# Patient Record
Sex: Female | Born: 1960 | Race: White | Hispanic: No | Marital: Married | Smoking: Former smoker
Health system: Southern US, Community
[De-identification: ages and names within clinical notes are randomized; demographics above are authoritative.]

## PROBLEM LIST (undated history)

## (undated) DIAGNOSIS — N83209 Unspecified ovarian cyst, unspecified side: Secondary | ICD-10-CM

## (undated) DIAGNOSIS — K5900 Constipation, unspecified: Secondary | ICD-10-CM

## (undated) DIAGNOSIS — M810 Age-related osteoporosis without current pathological fracture: Secondary | ICD-10-CM

## (undated) DIAGNOSIS — T7840XA Allergy, unspecified, initial encounter: Secondary | ICD-10-CM

## (undated) DIAGNOSIS — Z889 Allergy status to unspecified drugs, medicaments and biological substances status: Secondary | ICD-10-CM

## (undated) DIAGNOSIS — K589 Irritable bowel syndrome without diarrhea: Secondary | ICD-10-CM

## (undated) DIAGNOSIS — Z8619 Personal history of other infectious and parasitic diseases: Secondary | ICD-10-CM

## (undated) DIAGNOSIS — Z820 Family history of epilepsy and other diseases of the nervous system: Secondary | ICD-10-CM

## (undated) DIAGNOSIS — M8580 Other specified disorders of bone density and structure, unspecified site: Secondary | ICD-10-CM

## (undated) DIAGNOSIS — F419 Anxiety disorder, unspecified: Secondary | ICD-10-CM

## (undated) DIAGNOSIS — K219 Gastro-esophageal reflux disease without esophagitis: Secondary | ICD-10-CM

## (undated) DIAGNOSIS — H269 Unspecified cataract: Secondary | ICD-10-CM

## (undated) DIAGNOSIS — I10 Essential (primary) hypertension: Secondary | ICD-10-CM

## (undated) DIAGNOSIS — R011 Cardiac murmur, unspecified: Secondary | ICD-10-CM

## (undated) DIAGNOSIS — E785 Hyperlipidemia, unspecified: Secondary | ICD-10-CM

## (undated) HISTORY — DX: Unspecified ovarian cyst, unspecified side: N83.209

## (undated) HISTORY — DX: Family history of epilepsy and other diseases of the nervous system: Z82.0

## (undated) HISTORY — DX: Allergy, unspecified, initial encounter: T78.40XA

## (undated) HISTORY — PX: POLYPECTOMY: SHX149

## (undated) HISTORY — DX: Constipation, unspecified: K59.00

## (undated) HISTORY — DX: Personal history of other infectious and parasitic diseases: Z86.19

## (undated) HISTORY — DX: Anxiety disorder, unspecified: F41.9

## (undated) HISTORY — DX: Essential (primary) hypertension: I10

## (undated) HISTORY — PX: COLONOSCOPY: SHX174

## (undated) HISTORY — DX: Age-related osteoporosis without current pathological fracture: M81.0

## (undated) HISTORY — PX: OTHER SURGICAL HISTORY: SHX169

## (undated) HISTORY — DX: Cardiac murmur, unspecified: R01.1

## (undated) HISTORY — DX: Irritable bowel syndrome, unspecified: K58.9

## (undated) HISTORY — DX: Hyperlipidemia, unspecified: E78.5

## (undated) HISTORY — DX: Other specified disorders of bone density and structure, unspecified site: M85.80

## (undated) HISTORY — DX: Unspecified cataract: H26.9

---
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
## 1997-11-16 ENCOUNTER — Other Ambulatory Visit: Admission: RE | Admit: 1997-11-16 | Discharge: 1997-11-16 | Payer: Self-pay | Admitting: Obstetrics and Gynecology

## 1999-05-14 ENCOUNTER — Other Ambulatory Visit: Admission: RE | Admit: 1999-05-14 | Discharge: 1999-05-14 | Payer: Self-pay | Admitting: Obstetrics and Gynecology

## 2000-07-24 ENCOUNTER — Other Ambulatory Visit: Admission: RE | Admit: 2000-07-24 | Discharge: 2000-07-24 | Payer: Self-pay | Admitting: Obstetrics and Gynecology

## 2002-03-16 ENCOUNTER — Other Ambulatory Visit: Admission: RE | Admit: 2002-03-16 | Discharge: 2002-03-16 | Payer: Self-pay | Admitting: Obstetrics and Gynecology

## 2003-04-17 ENCOUNTER — Other Ambulatory Visit: Admission: RE | Admit: 2003-04-17 | Discharge: 2003-04-17 | Payer: Self-pay | Admitting: Obstetrics and Gynecology

## 2004-05-28 ENCOUNTER — Other Ambulatory Visit: Admission: RE | Admit: 2004-05-28 | Discharge: 2004-05-28 | Payer: Self-pay | Admitting: Obstetrics and Gynecology

## 2004-06-05 ENCOUNTER — Encounter: Admission: RE | Admit: 2004-06-05 | Discharge: 2004-06-05 | Payer: Self-pay | Admitting: Obstetrics and Gynecology

## 2005-06-09 ENCOUNTER — Encounter: Admission: RE | Admit: 2005-06-09 | Discharge: 2005-06-09 | Payer: Self-pay | Admitting: Obstetrics and Gynecology

## 2005-07-01 ENCOUNTER — Other Ambulatory Visit: Admission: RE | Admit: 2005-07-01 | Discharge: 2005-07-01 | Payer: Self-pay | Admitting: Obstetrics and Gynecology

## 2011-04-15 ENCOUNTER — Other Ambulatory Visit (HOSPITAL_COMMUNITY): Payer: Self-pay | Admitting: Obstetrics and Gynecology

## 2011-04-15 DIAGNOSIS — E049 Nontoxic goiter, unspecified: Secondary | ICD-10-CM

## 2011-04-17 ENCOUNTER — Other Ambulatory Visit (HOSPITAL_COMMUNITY): Payer: Self-pay

## 2011-04-23 ENCOUNTER — Ambulatory Visit (HOSPITAL_COMMUNITY)
Admission: RE | Admit: 2011-04-23 | Discharge: 2011-04-23 | Disposition: A | Discharge status: Home / Self Care | Payer: BC Managed Care – PPO | Source: Ambulatory Visit | Attending: Obstetrics and Gynecology | Admitting: Obstetrics and Gynecology

## 2011-04-23 DIAGNOSIS — E049 Nontoxic goiter, unspecified: Secondary | ICD-10-CM

## 2011-04-23 DIAGNOSIS — E041 Nontoxic single thyroid nodule: Secondary | ICD-10-CM | POA: Insufficient documentation

## 2011-05-06 ENCOUNTER — Ambulatory Visit (INDEPENDENT_AMBULATORY_CARE_PROVIDER_SITE_OTHER): Payer: BC Managed Care – PPO | Admitting: Surgery

## 2011-05-06 ENCOUNTER — Encounter (INDEPENDENT_AMBULATORY_CARE_PROVIDER_SITE_OTHER): Payer: Self-pay | Admitting: Surgery

## 2011-05-06 VITALS — BP 122/78 | HR 66 | Temp 98.0°F | Resp 16 | Ht 67.25 in | Wt 177.6 lb

## 2011-05-06 DIAGNOSIS — E041 Nontoxic single thyroid nodule: Secondary | ICD-10-CM

## 2011-05-06 DIAGNOSIS — E042 Nontoxic multinodular goiter: Secondary | ICD-10-CM | POA: Insufficient documentation

## 2011-05-06 NOTE — Progress Notes (Signed)
Chief Complaint  Patient presents with  . Thyroid Nodule    Evaluate Thyroid & Parathyroid - referral by Dr. Richardean Chimera    HISTORY: Patient is a 50 year old female referred by her gynecologist for her newly diagnosed thyroid nodules. Patient had been seen for her annual exam in September 2012. She was found on physical exam to have irregularity of the thyroid. She underwent thyroid ultrasound which showed a normal sized thyroid gland. In the left inferior pole was a 1.2 cm nodule. Below the right thyroid lobe was a 1.6 cm nodule suspicious for parathyroid adenoma. Laboratory studies were obtained. TSH level was normal at 3.154. Total T4 level was normal at 9.0. Intact parathyroid hormone level was normal at 40. Serum calcium level was normal at 9.7.  Patient is referred at this time for evaluation of thyroid nodules. Patient does have a history of a sister with likely thyroid nodule. She has undergone biopsy which is presumably benign. There is no family history of thyroid cancer. The patient has not had any head or neck surgery. She has never been on thyroid medication.   Past Medical History  Diagnosis Date  . FHx: migraine headaches   . Heart murmur     H/O   . Hypertension      No current outpatient prescriptions on file.     No Known Allergies   Family History  Problem Relation Age of Onset  . Cancer Mother 46    History of Colon Cancer 2004  . Diabetes Father   . Arthritis Father   . Heart disease Father   . Cancer Maternal Uncle   . Cancer Maternal Grandmother   . Cancer Maternal Uncle   . Cancer Cousin      History   Social History  . Marital Status: Single    Spouse Name: N/A    Number of Children: N/A  . Years of Education: N/A   Social History Main Topics  . Smoking status: Former Games developer  . Smokeless tobacco: None  . Alcohol Use: No  . Drug Use: No  . Sexually Active: None   Other Topics Concern  . None   Social History Narrative  . None      REVIEW OF SYSTEMS - PERTINENT POSITIVES ONLY: Patient does note fatigue. This is significantly increased compared to past years. She has noted difficulty in losing weight although she has lost 10 pounds over the last year and a half with an exercise and diet program. She denies dysphagia. She denies tremor. She denies palpitations.   EXAM: Filed Vitals:   05/06/11 1651  BP: 122/78  Pulse: 66  Temp: 98 F (36.7 C)  Resp: 16    HEENT: normocephalic; pupils equal and reactive; sclerae clear; dentition good; mucous membranes moist NECK:  On palpation the right thyroid lobe is slightly more firm and slightly irregular compared to the left lobe which is soft and normal. There is a palpable nodule in the inferior pole of the left thyroid lobe measuring slightly greater than 1 cm in size. It is soft and nontender; symmetric on extension; no palpable anterior or posterior cervical lymphadenopathy; no supraclavicular masses; no tenderness CHEST: clear to auscultation bilaterally without rales, rhonchi, or wheezes CARDIAC: regular rate and rhythm without significant murmur; peripheral pulses are full EXT:  non-tender without edema; no deformity NEURO: no gross focal deficits; no sign of tremor   LABORATORY RESULTS: See E-Chart for most recent results   RADIOLOGY RESULTS: See E-Chart or I-Site for  most recent results   IMPRESSION: #1 left thyroid nodule, 1.2 cm #2 right thyroid nodule, 1.6 cm, slightly below the normal right thyroid lobe on ultrasound scan #3 normal parathyroid hormone level and serum calcium level   PLAN: The patient and I discussed the above findings at length. I think it is safe to observe her newly diagnosed thyroid nodules for the time being. I have recommended a short-term followup with thyroid ultrasound and TSH level in 6 months. At that time we will consider thyroid hormone suppression versus possible fine needle aspiration biopsy.  Patient will return in  6 months for physical examination and review of her ultrasound results and her TSH level.   Velora Heckler, MD, FACS General & Endocrine Surgery [facility]      Visit Diagnoses: 1. Thyroid nodule, uninodular     Primary Care Physician: Abner Greenspan, MD  GYN:  Richardean Chimera MD

## 2011-11-04 ENCOUNTER — Ambulatory Visit
Admission: RE | Admit: 2011-11-04 | Discharge: 2011-11-04 | Disposition: A | Discharge status: Home / Self Care | Payer: BC Managed Care – PPO | Source: Ambulatory Visit | Attending: Surgery | Admitting: Surgery

## 2011-11-04 DIAGNOSIS — E041 Nontoxic single thyroid nodule: Secondary | ICD-10-CM

## 2011-11-07 ENCOUNTER — Telehealth (INDEPENDENT_AMBULATORY_CARE_PROVIDER_SITE_OTHER): Payer: Self-pay

## 2011-11-07 ENCOUNTER — Other Ambulatory Visit (INDEPENDENT_AMBULATORY_CARE_PROVIDER_SITE_OTHER): Payer: Self-pay

## 2011-11-07 DIAGNOSIS — E041 Nontoxic single thyroid nodule: Secondary | ICD-10-CM

## 2011-11-07 NOTE — Telephone Encounter (Signed)
The patient would like her ultrasound results.

## 2011-11-07 NOTE — Telephone Encounter (Signed)
Generic message left advising patient to schedule an appointment here in the office and review test results.

## 2011-12-08 ENCOUNTER — Telehealth (INDEPENDENT_AMBULATORY_CARE_PROVIDER_SITE_OTHER): Payer: Self-pay

## 2011-12-08 NOTE — Telephone Encounter (Signed)
Order for labs at [facility] in epic. LMOM for pt. Need tsh prior to appt. On 5-21.

## 2011-12-16 ENCOUNTER — Ambulatory Visit (INDEPENDENT_AMBULATORY_CARE_PROVIDER_SITE_OTHER): Payer: BC Managed Care – PPO | Admitting: Surgery

## 2011-12-16 ENCOUNTER — Encounter (INDEPENDENT_AMBULATORY_CARE_PROVIDER_SITE_OTHER): Payer: Self-pay | Admitting: Surgery

## 2011-12-16 VITALS — BP 130/72 | HR 64 | Temp 98.7°F | Resp 12 | Ht 67.5 in | Wt 172.4 lb

## 2011-12-16 DIAGNOSIS — E041 Nontoxic single thyroid nodule: Secondary | ICD-10-CM

## 2011-12-16 NOTE — Patient Instructions (Signed)
Thyroid Diseases Your thyroid is a butterfly-shaped gland in your neck. It is located just above your collarbone. It is one of your endocrine glands, which make hormones. The thyroid helps set your metabolism. Metabolism is how your body gets energy from the foods you eat.  Millions of people have thyroid diseases. Women experience thyroid problems more often than men. In fact, overactive thyroid problems (hyperthyroidism) occur in 1% of all women. If you have a thyroid disease, your body may use energy more slowly or quickly than it should.  Thyroid problems also include an immune disease where your body reacts against your thyroid gland (called thyroiditis). A different problem involves lumps and bumps (called nodules) that develop in the gland. The nodules are usually, but not always, noncancerous. THE MOST COMMON THYROID PROBLEMS AND CAUSES ARE DISCUSSED BELOW There are many causes for thyroid problems. Treatment depends upon the exact diagnosis and includes trying to reset your body's metabolism to a normal rate. Hyperthyroidism Too much thyroid hormone from an overactive thyroid gland is called hyperthyroidism. In hyperthyroidism, the body's metabolism speeds up. One of the most frequent forms of hyperthyroidism is known as Graves' disease. Graves' disease tends to run in families. Although Graves' is thought to be caused by a problem with the immune system, the exact nature of the genetic problem is unknown. Hypothyroidism Too little thyroid hormone from an underactive thyroid gland is called hypothyroidism. In hypothyroidism, the body's metabolism is slowed. Several things can cause this condition. Most causes affect the thyroid gland directly and hurt its ability to make enough hormone.  Rarely, there may be a pituitary gland tumor (located near the base of the brain). The tumor can block the pituitary from producing thyroid-stimulating hormone (TSH). Your body makes TSH to stimulate the thyroid  to work properly. If the pituitary does not make enough TSH, the thyroid fails to make enough hormones needed for good health. Whether the problem is caused by thyroid conditions or by the pituitary gland, the result is that the thyroid is not making enough hormones. Hypothyroidism causes many physical and mental processes to become sluggish. The body consumes less oxygen and produces less body heat. Thyroid Nodules A thyroid nodule is a small swelling or lump in the thyroid gland. They are common. These nodules represent either a growth of thyroid tissue or a fluid-filled cyst. Both form a lump in the thyroid gland. Almost half of all people will have tiny thyroid nodules at some point in their lives. Typically, these are not noticeable until they become large and affect normal thyroid size. Larger nodules that are greater than a half inch across (about 1 centimeter) occur in about 5 percent of people. Although most nodules are not cancerous, people who have them should seek medical care to rule out cancer. Also, some thyroid nodules may produce too much thyroid hormone or become too large. Large nodules or a large gland can interfere with breathing or swallowing or may cause neck discomfort. Other problems Other thyroid problems include cancer and thyroiditis. Thyroiditis is a malfunction of the body's immune system. Normally, the immune system works to defend the body against infection and other problems. When the immune system is not working properly, it may mistakenly attack normal cells, tissues, and organs. Examples of autoimmune diseases are Hashimoto's thyroiditis (which causes low thyroid function) and Graves' disease (which causes excess thyroid function). SYMPTOMS  Symptoms vary greatly depending upon the exact type of problem with the thyroid. Hyperthyroidism-is when your thyroid is too   active and makes more thyroid hormone than your body needs. The most common cause is Graves' Disease. Too  much thyroid hormone can cause some or all of the following symptoms:  Anxiety.   Irritability.   Difficulty sleeping.   Fatigue.   A rapid or irregular heartbeat.   A fine tremor of your hands or fingers.   An increase in perspiration.   Sensitivity to heat.   Weight loss, despite normal food intake.   Brittle hair.   Enlargement of your thyroid gland (goiter).   Light menstrual periods.   Frequent bowel movements.  Graves' disease can specifically cause eye and skin problems. The skin problems involve reddening and swelling of the skin, often on your shins and on the top of your feet. Eye problems can include the following:  Excess tearing and sensation of grit or sand in either or both eyes.   Reddened or inflamed eyes.   Widening of the space between your eyelids.   Swelling of the lids and tissues around the eyes.   Light sensitivity.   Ulcers on the cornea.   Double vision.   Limited eye movements.   Blurred or reduced vision.  Hypothyroidism- is when your thyroid gland is not active enough. This is more common than hyperthyroidism. Symptoms can vary a lot depending of the severity of the hormone deficiency. Symptoms may develop over a long period of time and can include several of the following:  Fatigue.   Sluggishness.   Increased sensitivity to cold.   Constipation.   Pale, dry skin.   A puffy face.   Hoarse voice.   High blood cholesterol level.   Unexplained weight gain.   Muscle aches, tenderness and stiffness.   Pain, stiffness or swelling in your joints.   Muscle weakness.   Heavier than normal menstrual periods.   Brittle fingernails and hair.   Depression.  Thyroid Nodules - most do not cause signs or symptoms. Occasionally, some may become so large that you can feel or even see the swelling at the base of your neck. You may realize a lump or swelling is there when you are shaving or putting on makeup. Men might become  aware of a nodule when shirt collars suddenly feel too tight. Some nodules produce too much thyroid hormone. This can produce the same symptoms as hyperthyroidism (see above). Thyroid nodules are seldom cancerous. However, a nodule is more likely to be malignant (cancerous) if it:  Grows quickly or feels hard.   Causes you to become hoarse or to have trouble swallowing or breathing.   Causes enlarged lymph nodes under your jaw or in your neck.  DIAGNOSIS  Because there are so many possible thyroid conditions, your caregiver may ask for a number of tests. They will do this in order to narrow down the exact diagnosis. These tests can include:  Blood and antibody tests.   Special thyroid scans using small, safe amounts of radioactive iodine.   Ultrasound of the thyroid gland (particularly if there is a nodule or lump).   Biopsy. This is usually done with a special needle. A needle biopsy is a procedure to obtain a sample of cells from the thyroid. The tissue will be tested in a lab and examined under a microscope.  TREATMENT  Treatment depends on the exact diagnosis. Hyperthyroidism  Beta-blockers help relieve many of the symptoms.   Anti-thyroid medications prevent the thyroid from making excess hormones.   Radioactive iodine treatment can destroy overactive thyroid   cells. The iodine can permanently decrease the amount of hormone produced.   Surgery to remove the thyroid gland.   Treatments for eye problems that come from Graves' disease also include medications and special eye surgery, if felt to be appropriate.  Hypothyroidism Thyroid replacement with levothyroxine is the mainstay of treatment. Treatment with thyroid replacement is usually lifelong and will require monitoring and adjustment from time to time. Thyroid Nodules  Watchful waiting. If a small nodule causes no symptoms or signs of cancer on biopsy, then no treatment may be chosen at first. Re-exam and re-checking blood  tests would be the recommended follow-up.   Anti-thyroid medications or radioactive iodine treatment may be recommended if the nodules produce too much thyroid hormone (see Treatment for Hyperthyroidism above).   Alcohol ablation. Injections of small amounts of ethyl alcohol (ethanol) can cause a non-cancerous nodule to shrink in size.   Surgery (see Treatment for Hyperthyroidism above).  HOME CARE INSTRUCTIONS   Take medications as instructed.   Follow through on recommended testing.  SEEK MEDICAL CARE IF:   You feel that you are developing symptoms of Hyperthyroidism or Hypothyroidism as described above.   You develop a new lump/nodule in the neck/thyroid area that you had not noticed before.   You feel that you are having side effects from medicines prescribed.   You develop trouble breathing or swallowing.  SEEK IMMEDIATE MEDICAL CARE IF:   You develop a fever of 102 F (38.9 C) or higher.   You develop severe sweating.   You develop palpitations and/or rapid heart beat.   You develop shortness of breath.   You develop nausea and vomiting.   You develop extreme shakiness.   You develop agitation.   You develop lightheadedness or have a fainting episode.  Document Released: 05/11/2007 Document Revised: 07/03/2011 Document Reviewed: 05/11/2007 [employer] Patient Information 2012 [employer]. 

## 2011-12-16 NOTE — Progress Notes (Signed)
Visit Diagnoses: 1. Thyroid nodules     HISTORY: Patient is a 51 year old white female who returns today for followup of thyroid nodules. Patient had been evaluated in the fall of 2012. She returns at this time for examination. On November 04, 2011 she underwent repeat thyroid ultrasound. This showed the overall size of the gland to be stable. The left-sided thyroid nodule remained stable at 1.3 cm. There were no worrisome findings. The questionable right thyroid nodule appears to be part of the thyroid lobe and not a distinct nodule. No other abnormalities were identified. TSH level was obtained by her primary care physician and was normal at 1.75. Patient has never been on thyroid medication.  PERTINENT REVIEW OF SYSTEMS: No change and self exam. No new nodules or masses. No pain or tenderness. No tremors. No palpitations.  EXAM: HEENT: normocephalic; pupils equal and reactive; sclerae clear; dentition good; mucous membranes moist NECK:  Palpable soft nodule left inferior lobe measuring approximately 1.5 cm in dimension. Right lobe without palpable nodules; symmetric on extension; no palpable anterior or posterior cervical lymphadenopathy; no supraclavicular masses; no tenderness CHEST: clear to auscultation bilaterally without rales, rhonchi, or wheezes CARDIAC: regular rate and rhythm without significant murmur; peripheral pulses are full EXT:  non-tender without edema; no deformity NEURO: no gross focal deficits; no sign of tremor   IMPRESSION: Left thyroid nodule, 1.3 cm, clinically stable  PLAN: The patient and I reviewed the above studies. I have reassured her that this is likely a benign thyroid nodule. I would like to re-evaluate it in one year with a followup thyroid ultrasound and a TSH level. She will return at that time for physical examination.  Velora Heckler, MD, FACS General & Endocrine Surgery [facility]

## 2012-01-06 ENCOUNTER — Encounter (INDEPENDENT_AMBULATORY_CARE_PROVIDER_SITE_OTHER): Payer: Self-pay

## 2012-03-14 ENCOUNTER — Encounter (HOSPITAL_COMMUNITY): Payer: Self-pay

## 2012-03-14 ENCOUNTER — Inpatient Hospital Stay (HOSPITAL_COMMUNITY)
Admission: AD | Admit: 2012-03-14 | Discharge: 2012-03-14 | Disposition: A | Discharge status: Home / Self Care | Payer: BC Managed Care – PPO | Source: Ambulatory Visit | Attending: Obstetrics and Gynecology | Admitting: Obstetrics and Gynecology

## 2012-03-14 ENCOUNTER — Inpatient Hospital Stay (HOSPITAL_COMMUNITY): Payer: BC Managed Care – PPO

## 2012-03-14 DIAGNOSIS — R109 Unspecified abdominal pain: Secondary | ICD-10-CM

## 2012-03-14 DIAGNOSIS — K5900 Constipation, unspecified: Secondary | ICD-10-CM | POA: Insufficient documentation

## 2012-03-14 LAB — URINALYSIS, ROUTINE W REFLEX MICROSCOPIC
Bilirubin Urine: NEGATIVE
Glucose, UA: NEGATIVE mg/dL
Ketones, ur: NEGATIVE mg/dL
Leukocytes, UA: NEGATIVE
Nitrite: NEGATIVE
Protein, ur: NEGATIVE mg/dL
Specific Gravity, Urine: 1.01 (ref 1.005–1.030)
Urobilinogen, UA: 0.2 mg/dL (ref 0.0–1.0)
pH: 7 (ref 5.0–8.0)

## 2012-03-14 LAB — COMPREHENSIVE METABOLIC PANEL
ALT: 12 U/L (ref 0–35)
AST: 16 U/L (ref 0–37)
Albumin: 4.2 g/dL (ref 3.5–5.2)
Alkaline Phosphatase: 57 U/L (ref 39–117)
BUN: 7 mg/dL (ref 6–23)
CO2: 32 mEq/L (ref 19–32)
Calcium: 10.2 mg/dL (ref 8.4–10.5)
Chloride: 97 mEq/L (ref 96–112)
Creatinine, Ser: 0.73 mg/dL (ref 0.50–1.10)
GFR calc Af Amer: >90 mL/min (ref >90)
GFR calc non Af Amer: >90 mL/min (ref >90)
Glucose, Bld: 103 mg/dL — ABNORMAL HIGH (ref 70–99)
Potassium: 4.9 mEq/L (ref 3.5–5.1)
Sodium: 137 mEq/L (ref 135–145)
Total Bilirubin: 0.6 mg/dL (ref 0.3–1.2)
Total Protein: 7.3 g/dL (ref 6.0–8.3)

## 2012-03-14 LAB — URINE MICROSCOPIC-ADD ON

## 2012-03-14 LAB — CBC
HCT: 41.4 % (ref 36.0–46.0)
Hemoglobin: 14.2 g/dL (ref 12.0–15.0)
MCH: 29.3 pg (ref 26.0–34.0)
MCHC: 34.3 g/dL (ref 30.0–36.0)
MCV: 85.4 fL (ref 78.0–100.0)
Platelets: 219 10*3/uL (ref 150–400)
RBC: 4.85 MIL/uL (ref 3.87–5.11)
RDW: 14.7 % (ref 11.5–15.5)
WBC: 9.1 10*3/uL (ref 4.0–10.5)

## 2012-03-14 LAB — POCT PREGNANCY, URINE: Preg Test, Ur: NEGATIVE

## 2012-03-14 MED ORDER — PROMETHAZINE HCL 25 MG PO TABS: 25.0000 mg | ORAL_TABLET | Freq: Four times a day (QID) | ORAL | Status: DC | PRN

## 2012-03-14 NOTE — MAU Note (Addendum)
Pt reports having lower abd  For about 10 days more on left side and now radiated towards her back. Reports havign n/v unable to keep much down. No bm since Tues. Saw Dr. Arelia Sneddon on  Friday. Given medication for BV and nuasea and removed IUD. Stil not feeling well today unable to keep much down.

## 2012-03-14 NOTE — MAU Provider Note (Signed)
History     CSN: [id number]  Arrival date & time 03/14/12  1317   None     Chief Complaint  Patient presents with  . Abdominal Pain    (Consider location/radiation/quality/duration/timing/severity/associated sxs/prior treatment) HPI Pamela Luna is a 51 y.o. G1P1.She presents with continued abd pain, nausea and vomiting. Her symptoms started 8/13, she saw Dr Arelia Sneddon on 8/16. He removed her IUD, Rx for clindamycin for BV. Pt continues to have low abd cramping, burning, contracting. She is nauseated q am, has been vomiting today. Has lots of gas, belching. Last full meal 8/13, has had decreased appetite, vomited after eating mostly since. Last nl BM 8/13, sm hard BM today with blood on it. Last colonoscopy 5 yr ago, 1 benign polyp. may have "a touch" of IBS, strong family hx of colon cancer. Next colonoscopy for 8/28.  Past Medical History  Diagnosis Date  . FHx: migraine headaches   . Heart murmur     H/O   . Hypertension     Past Surgical History  Procedure Date  . Tonsillectomy 1966  . Cesarean section 1998  . Scalp cyst excision 2006 - 2009    Family History  Problem Relation Age of Onset  . Cancer Mother 44    History of Colon Cancer 2004  . Diabetes Father   . Arthritis Father   . Heart disease Father   . Cancer Maternal Uncle   . Cancer Maternal Grandmother   . Cancer Maternal Uncle   . Cancer Cousin     History  Substance Use Topics  . Smoking status: Former Smoker    Quit date: 12/15/2001  . Smokeless tobacco: Not on file  . Alcohol Use: No    OB History    Grav Para Term Preterm Abortions TAB SAB Ect Mult Living   1 1        1       Review of Systems  Constitutional: Negative for fever.  Gastrointestinal: Positive for abdominal distention.       Nausea, vomiting, constipation  Genitourinary: Positive for frequency. Negative for dysuria, urgency, vaginal bleeding and vaginal discharge.    Allergies  Review of patient's allergies indicates no  known allergies.  Home Medications  No current outpatient prescriptions on file.  BP 141/85  Pulse 79  Temp 97.9 F (36.6 C)  Resp 18  Ht 5' 7.5" (1.715 m)  Wt 170 lb 9.6 oz (77.384 kg)  BMI 26.33 kg/m2  Physical Exam  Constitutional: She is oriented to person, place, and time. She appears well-developed and well-nourished. No distress.  Abdominal:       Abd soft, mildly tender all quads. + BS, no guarding, rebound or mass palp.  Musculoskeletal: Normal range of motion.  Neurological: She is alert and oriented to person, place, and time.  Skin: Skin is warm and dry.  Psychiatric: She has a normal mood and affect. Her behavior is normal.    ED Course  Procedures (including critical care time)  Labs Reviewed  URINALYSIS, ROUTINE W REFLEX MICROSCOPIC - Abnormal; Notable for the following:    Hgb urine dipstick SMALL (*)     All other components within normal limits  POCT PREGNANCY, URINE  URINE MICROSCOPIC-ADD ON   No results found.  Results for orders placed during the hospital encounter of 03/14/12 (from the past 24 hour(s))  URINALYSIS, ROUTINE W REFLEX MICROSCOPIC     Status: Abnormal   Collection Time   03/14/12  1:45 PM  Component Value Range   Color, Urine YELLOW  YELLOW   APPearance CLEAR  CLEAR   Specific Gravity, Urine 1.010  1.005 - 1.030   pH 7.0  5.0 - 8.0   Glucose, UA NEGATIVE  NEGATIVE mg/dL   Hgb urine dipstick SMALL (*) NEGATIVE   Bilirubin Urine NEGATIVE  NEGATIVE   Ketones, ur NEGATIVE  NEGATIVE mg/dL   Protein, ur NEGATIVE  NEGATIVE mg/dL   Urobilinogen, UA 0.2  0.0 - 1.0 mg/dL   Nitrite NEGATIVE  NEGATIVE   Leukocytes, UA NEGATIVE  NEGATIVE  URINE MICROSCOPIC-ADD ON     Status: Normal   Collection Time   03/14/12  1:45 PM      Component Value Range   Squamous Epithelial / LPF RARE  RARE   RBC / HPF 0-2  <3 RBC/hpf   Bacteria, UA RARE  RARE  POCT PREGNANCY, URINE     Status: Normal   Collection Time   03/14/12  1:52 PM      Component  Value Range   Preg Test, Ur NEGATIVE  NEGATIVE  CBC     Status: Normal   Collection Time   03/14/12  3:46 PM      Component Value Range   WBC 9.1  4.0 - 10.5 K/uL   RBC 4.85  3.87 - 5.11 MIL/uL   Hemoglobin 14.2  12.0 - 15.0 g/dL   HCT 16.1  09.6 - 04.5 %   MCV 85.4  78.0 - 100.0 fL   MCH 29.3  26.0 - 34.0 pg   MCHC 34.3  30.0 - 36.0 g/dL   RDW 40.9  81.1 - 91.4 %   Platelets 219  150 - 400 K/uL  COMPREHENSIVE METABOLIC PANEL     Status: Abnormal   Collection Time   03/14/12  3:46 PM      Component Value Range   Sodium 137  135 - 145 mEq/L   Potassium 4.9  3.5 - 5.1 mEq/L   Chloride 97  96 - 112 mEq/L   CO2 32  19 - 32 mEq/L   Glucose, Bld 103 (*) 70 - 99 mg/dL   BUN 7  6 - 23 mg/dL   Creatinine, Ser 7.82  0.50 - 1.10 mg/dL   Calcium 95.6  8.4 - 21.3 mg/dL   Total Protein 7.3  6.0 - 8.3 g/dL   Albumin 4.2  3.5 - 5.2 g/dL   AST 16  0 - 37 U/L   ALT 12  0 - 35 U/L   Alkaline Phosphatase 57  39 - 117 U/L   Total Bilirubin 0.6  0.3 - 1.2 mg/dL   GFR calc non Af Amer >90  >90 mL/min   GFR calc Af Amer >90  >90 mL/min    No diagnosis found.  Dg Abd 1 View  03/14/2012  *RADIOLOGY REPORT*  Clinical Data: Abdominal pain with nausea and vomiting.  ABDOMEN - 1 VIEW  Comparison: None.  Findings: Stool is seen throughout the colon.  No small bowel dilatation.  No unexpected radiopaque calculi.  IMPRESSION: Constipation.  Original Report Authenticated By: Reyes Ivan, M.D.     MDM Consulted with Dr Renaldo Fiddler for orders    ASSESSMENT: Constipation  PLAN: OTC tx reviewed, Miralax, probiotics, gas-x,increase fluids, fresh fruits/veggies. F/u with her GI

## 2012-03-14 NOTE — MAU Note (Signed)
Patient states she has been taking an antibiotic ? Bacterial infection, states that Dr. Arelia Sneddon has been concerned she may have a bowel obstruction.

## 2012-04-01 ENCOUNTER — Ambulatory Visit (HOSPITAL_COMMUNITY)
Admission: RE | Admit: 2012-04-01 | Discharge: 2012-04-01 | Disposition: A | Discharge status: Home / Self Care | Payer: BC Managed Care – PPO | Source: Ambulatory Visit | Attending: Gynecologic Oncology | Admitting: Gynecologic Oncology

## 2012-04-01 ENCOUNTER — Encounter: Payer: Self-pay | Admitting: Gynecologic Oncology

## 2012-04-01 ENCOUNTER — Ambulatory Visit: Payer: BC Managed Care – PPO | Attending: Gynecologic Oncology | Admitting: Gynecologic Oncology

## 2012-04-01 ENCOUNTER — Other Ambulatory Visit: Payer: Self-pay | Admitting: Gynecologic Oncology

## 2012-04-01 ENCOUNTER — Other Ambulatory Visit: Payer: Self-pay | Admitting: *Deleted

## 2012-04-01 VITALS — BP 110/70 | HR 74 | Temp 98.6°F | Resp 16 | Ht 67.17 in | Wt 168.5 lb

## 2012-04-01 DIAGNOSIS — N83202 Unspecified ovarian cyst, left side: Secondary | ICD-10-CM

## 2012-04-01 DIAGNOSIS — Z79899 Other long term (current) drug therapy: Secondary | ICD-10-CM | POA: Insufficient documentation

## 2012-04-01 DIAGNOSIS — N83209 Unspecified ovarian cyst, unspecified side: Secondary | ICD-10-CM | POA: Insufficient documentation

## 2012-04-01 DIAGNOSIS — I10 Essential (primary) hypertension: Secondary | ICD-10-CM | POA: Insufficient documentation

## 2012-04-01 NOTE — Progress Notes (Signed)
Consult Note: Gyn-Onc  Consult was requested by Dr. Arelia Sneddon for the evaluation of Pamela Luna 51 y.o. female  CC:  Chief Complaint  Patient presents with  . Ovarian Cyst    New Consult    HPI: G1P1 LNMP 5 years ago.  Patient noted nausea and abdominal cramps on 03/06/2012.  Symptoms persisted, with sensation of warmth in the lower abdomen and back.  Patioent also reported poor appetite, denies vaginal discharge, no diarrhea but difficulty with defecation.  Laxatives have been necessary for BM since 03/06/2012.  Reports bloating for several years without any change, reports a feeling of heaviness and fullness.  IUD was removed 03/11/2012.  Seen in ER 03/20/2012 with c/o nausea and Dx constipation. She re-presented to Dr. Arelia Sneddon who completed an UTZ that demonstrated a 2.6 x 1.9 x 2 cm left ovarian cysts with a 6mm solid component.  Right ovary 2.0x 1.6 cm simple cyst.  Has used laxatives but still feels abdominal discomfort and fatigue.   Current Meds:  Outpatient Encounter Prescriptions as of 04/01/2012  Medication Sig Dispense Refill  . ibuprofen (ADVIL) 200 MG tablet Take 200 mg by mouth as needed.      Marland Kitchen lisinopril-hydrochlorothiazide (PRINZIDE,ZESTORETIC) 20-25 MG per tablet Daily.      . Multiple Vitamins-Minerals (HAIR/SKIN/NAILS PO) Take by mouth as needed.      . ondansetron (ZOFRAN) 8 MG tablet Take 8 mg by mouth every 8 (eight) hours as needed.      Marland Kitchen HYDROcodone-acetaminophen (VICODIN) 5-500 MG per tablet Take 1 tablet by mouth every 6 (six) hours as needed.      Marland Kitchen omeprazole (PRILOSEC) 20 MG capsule as needed.      . promethazine (PHENERGAN) 25 MG tablet Take 1 tablet (25 mg total) by mouth every 6 (six) hours as needed for nausea.  8 tablet  0    Allergy: No Known Allergies  Social Hx:   History   Social History  . Marital Status: Married    Spouse Name: N/A    Number of Children: N/A  . Years of Education: N/A   Occupational History  . Not on file.   Social History  Main Topics  . Smoking status: Former Smoker    Quit date: 12/15/2001  . Smokeless tobacco: Not on file  . Alcohol Use: Yes     Rarely  . Drug Use: No  . Sexually Active: Yes   Other Topics Concern  . Not on file   Social History Narrative  . 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    Past Surgical Hx:  Past Surgical History  Procedure Date  . Tonsillectomy 1966  . Cesarean section 1998  . Scalp cyst excision 2006 - 2009    Past Medical Hx:  Past Medical History  Diagnosis Date  . FHx: migraine headaches   . Heart murmur     H/O   . Hypertension     Past Gynecological History:  G1P1 Menarche 33 menopause 2008, IUD x 5 years  No LMP recorded. Patient is not currently having periods (Reason: Perimenopausal). Recalls a remote abnormal pap test. C/S   Colonoscopy 5 years ago and a benign polyp was removed. (Dr. Lynann Bologna) Mammogram 2012  Family Hx:  Family History  Problem Relation Age of Onset  . Cancer Mother 41    History of Colon Cancer 2004  . Diabetes Father   . Arthritis Father   . Heart disease Father   .  Cancer Maternal Uncle   . Cancer Maternal Grandmother   . Cancer Maternal Uncle   . Cancer Cousin    Maternal uncle colon cancer DX 70 years, maternal uncle lung cancer 70 years maternal GM stomach cancer at age 57.  Review of Systems:  Constitutional  Feels well Cardiovascular  No chest pain, intermittent shortness of breath without associated chest pain Pulmonary  No cough or wheeze.  Gastro Intestinal  Persistent  nausea, no vomitting, or diarrhoea. intermittent bright red blood per rectum, reports hemorrhoids, reports  abdominal fullness and constipation.  Genito Urinary  Urinary frequency and urgency, no incontinence, no vaginal bleeding or discharge Musculo Skeletal  Myalgia of the thighs. No arthralgia Neurologic  No weakness, numbness, change in gait,  Psychology  No depression, normal anxiety, insomnia.    Vitals:  Blood pressure 110/70, pulse 74, temperature 98.6 F (37 C), temperature source Oral, resp. rate 16, height 5' 7.17" (1.706 m), weight 168 lb 8 oz (76.431 kg).Body mass index is 26.26 kg/(m^2).   Physical Exam: WD in NAD Neck  Supple NROM, without any enlargements.  Lymph Node Survey No cervical supraclavicular or inguinal adenopathy Cardiovascular  Pulse normal rate, regularity and rhythm. S1 and S2 normal.  Lungs  Clear to auscultation bilateraly, without wheezes/crackles/rhonchi. Good air movement.  Skin  No rash/lesions/breakdown  Psychiatry  Alert and oriented to person, place, and time  Abdomen  Normoactive bowel sounds, abdomen soft, non-tender and obese. No palpable masses or ascites  Back No CVA tenderness Genito Urinary  Vulva/vagina: Normal external female genitalia.  No lesions. No discharge or bleeding.  Bladder/urethra:  No lesions or masses  Vagina: Atrophic without any lesion  Cervix: Normal appearing, no lesions.  Uterus: Small, mobile, no parametrial involvement or nodularity.  Adnexa: No palpable masses. No cul-de-sac nodularity Rectal  Good tone, no masses no cul de sac nodularity.  Extremities  No bilateral cyanosis, clubbing or edema.   Assessment/Plan:  [name]  is a 51 y.o.  year old with ultrasound findings that indicate a mural nodule measuring 6 mm in a left ovarian simple cyst that measures 2.6 cm. The right ovary has a 2.0 cm simple cyst. There is no evidence of free fluid within the pelvis.  CA 125 returned to value of 20.6.  Given the patient's symptoms and the ultrasound findings recommendation is for laparoscopy with left salpingo-oophorectomy. The patient is desirous of a bilateral salpingo-oophorectomy and hysterectomy  irrespective of whether or not the left adnexal mass is benign.  She was counseled regarding the risks of oophorectomy prior to age 58. She wishes to consider these surgical options and will present on  04/15/2012 for a discussion of the operative procedure that  will occur on 04/20/2012. She is aware that the surgeon for that procedure will be Dr. Cleda Mccreedy.   Laurette Schimke, MD, PhD 04/01/2012, 11:55 AM

## 2012-04-01 NOTE — Patient Instructions (Signed)
Followup September 19 to discuss unilateral salpingo-oophorectomy versus hysterectomy and bilateral salpingo-oophorectomy. Operative procedure planned for 04/20/2012.   Thank you very much Ms. Jyl Heinz for allowing me to provide care for you today.  I appreciate your confidence in choosing our Gynecologic Oncology team.  If you have any questions about your visit today please call our office and we will get back to you as soon as possible.  Maryclare Labrador. Kalecia Hartney MD., PhD Gynecologic Oncology

## 2012-04-08 ENCOUNTER — Telehealth: Payer: Self-pay | Admitting: *Deleted

## 2012-04-08 ENCOUNTER — Encounter: Payer: Self-pay | Admitting: Gynecologic Oncology

## 2012-04-08 ENCOUNTER — Ambulatory Visit: Payer: BC Managed Care – PPO | Attending: Gynecologic Oncology | Admitting: Gynecologic Oncology

## 2012-04-08 VITALS — BP 128/80 | HR 70 | Temp 98.0°F | Resp 18 | Ht 66.93 in | Wt 167.0 lb

## 2012-04-08 DIAGNOSIS — R5383 Other fatigue: Secondary | ICD-10-CM | POA: Insufficient documentation

## 2012-04-08 DIAGNOSIS — R142 Eructation: Secondary | ICD-10-CM | POA: Insufficient documentation

## 2012-04-08 DIAGNOSIS — R109 Unspecified abdominal pain: Secondary | ICD-10-CM | POA: Insufficient documentation

## 2012-04-08 DIAGNOSIS — R9389 Abnormal findings on diagnostic imaging of other specified body structures: Secondary | ICD-10-CM

## 2012-04-08 DIAGNOSIS — Z79899 Other long term (current) drug therapy: Secondary | ICD-10-CM | POA: Insufficient documentation

## 2012-04-08 DIAGNOSIS — K5900 Constipation, unspecified: Secondary | ICD-10-CM | POA: Insufficient documentation

## 2012-04-08 DIAGNOSIS — R141 Gas pain: Secondary | ICD-10-CM | POA: Insufficient documentation

## 2012-04-08 DIAGNOSIS — I10 Essential (primary) hypertension: Secondary | ICD-10-CM | POA: Insufficient documentation

## 2012-04-08 DIAGNOSIS — R5381 Other malaise: Secondary | ICD-10-CM | POA: Insufficient documentation

## 2012-04-08 DIAGNOSIS — R110 Nausea: Secondary | ICD-10-CM | POA: Insufficient documentation

## 2012-04-08 DIAGNOSIS — N83209 Unspecified ovarian cyst, unspecified side: Secondary | ICD-10-CM

## 2012-04-08 NOTE — Patient Instructions (Signed)
We will call you with your biopsy results.

## 2012-04-08 NOTE — Progress Notes (Signed)
Consult Note: Gyn-Onc  Consult was requested by Dr. Arelia Sneddon for the evaluation of Pamela Luna 51 y.o. female  CC:  Chief Complaint  Patient presents with  . Ovarian Cyst    New Consult    HPI: G1P1 LMP 5 years ago.  Patient noted nausea and abdominal cramps on 03/06/2012.  Symptoms persisted, with sensation of warmth in the lower abdomen and back.  Patioent also reported poor appetite, denies vaginal discharge, no diarrhea but difficulty with defecation.  Laxatives have been necessary for BM since 03/06/2012.  Reports bloating for several years without any change, reports a feeling of heaviness and fullness.  IUD was removed 03/11/2012.  Seen in ER 03/20/2012 with c/o nausea and Dx constipation. She re-presented to Dr. Arelia Sneddon who completed an U/S that demonstrated a 2.6 x 1.9 x 2 cm left ovarian cysts with a 6mm solid component.  Right ovary 2.0x 1.6 cm simple cyst.  Has used laxatives but still feels abdominal discomfort and fatigue. She has some back pain and the abdominal discomfort may be decreasing.  On ultrasound her endometrial stripe was homogenous but 7.2 mm.  No bleeding.  U/S was on 04/01/12 and her IUD was removed 03/12/12.  Current Meds:  Outpatient Encounter Prescriptions as of 04/01/2012  Medication Sig Dispense Refill  . ibuprofen (ADVIL) 200 MG tablet Take 200 mg by mouth as needed.      Marland Kitchen lisinopril-hydrochlorothiazide (PRINZIDE,ZESTORETIC) 20-25 MG per tablet Daily.      . Multiple Vitamins-Minerals (HAIR/SKIN/NAILS PO) Take by mouth as needed.      . ondansetron (ZOFRAN) 8 MG tablet Take 8 mg by mouth every 8 (eight) hours as needed.      Marland Kitchen HYDROcodone-acetaminophen (VICODIN) 5-500 MG per tablet Take 1 tablet by mouth every 6 (six) hours as needed.      Marland Kitchen omeprazole (PRILOSEC) 20 MG capsule as needed.      . promethazine (PHENERGAN) 25 MG tablet Take 1 tablet (25 mg total) by mouth every 6 (six) hours as needed for nausea.  8 tablet  0    Allergy: No Known Allergies  Social  Hx:   History   Social History  . Marital Status: Married    Spouse Name: N/A    Number of Children: N/A  . Years of Education: N/A   Occupational History  . Not on file.   Social History Main Topics  . Smoking status: Former Smoker    Quit date: 12/15/2001  . Smokeless tobacco: Not on file  . Alcohol Use: Yes     Rarely  . Drug Use: No  . Sexually Active: Yes   Other Topics Concern  . Not on file   Social History Narrative  . 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    Past Surgical Hx:  Past Surgical History  Procedure Date  . Tonsillectomy 1966  . Cesarean section 1998  . Scalp cyst excision 2006 - 2009    Past Medical Hx:  Past Medical History  Diagnosis Date  . FHx: migraine headaches   . Heart murmur     H/O   . Hypertension     Past Gynecological History:  G1P1 Menarche 1 menopause 2008, IUD x 5 years  No LMP recorded. Patient is not currently having periods (Reason: Perimenopausal). Recalls a remote abnormal pap test. C/S   Colonoscopy 5 years ago and a benign polyp was removed. (Dr. Lynann Bologna) Mammogram 2012  Family Hx:  Family  History  Problem Relation Age of Onset  . Cancer Mother 50    History of Colon Cancer 2004  . Diabetes Father   . Arthritis Father   . Heart disease Father   . Cancer Maternal Uncle   . Cancer Maternal Grandmother   . Cancer Maternal Uncle   . Cancer Cousin    Maternal uncle colon cancer DX 70 years, maternal uncle lung cancer 70 years maternal GM stomach cancer at age 46.  Review of Systems:   Vitals:  Blood pressure 128/80, pulse 70, temperature 98.0 F, temperature source Oral, resp. rate 18, height 5' 7.17" (1.706 m), weight 167 lb   Physical Exam: WD in NAD Genito Urinary  Vulva/vagina: Normal external female genitalia.  No lesions. No discharge or bleeding.  Bladder/urethra:  No lesions or masses  Vagina: Atrophic without any lesion  Cervix: Normal appearing, no lesions.  After  obtaining her verbal consent an endometrial biopsy was performed.  Cervix was grasped with a single tooth tenaculum and bx done.  Uterus sounded to 9 cm. One pass with minimal tissue. She tolerated it well. No bleeding from the tenaculum sites.   Assessment/Plan:  Pamela Luna  is a 51 y.o.  year old with ultrasound findings that indicate a mural nodule measuring 6 mm in a left ovarian simple cyst that measures 2.6 cm. The right ovary has a 2.0 cm simple cyst. There is no evidence of free fluid within the pelvis.  CA 125 returned to value of 20.6.  Given the patient's symptoms and the ultrasound findings recommendation is for laparoscopy with left salpingo-oophorectomy. The patient is desirous of a bilateral salpingo-oophorectomy and hysterectomy  irrespective of whether or not the left adnexal mass is benign.  She was counseled regarding the risks of oophorectomy prior to age 29. She wishes to consider these surgical options. She is still weighing her surgical options and I, as did Dr. Nelly Rout, counseled her to a more conservative approach. We will call her with the endometrial biopsy results.  If benign, would recommend USO only with frozen section.  If endometrial biopsy with cancer then complete hysterectomy with associated staging.  Briya Lookabaugh A. Duard Brady, MD 04/15/2012

## 2012-04-08 NOTE — Telephone Encounter (Signed)
Error

## 2012-04-15 ENCOUNTER — Encounter (HOSPITAL_COMMUNITY): Payer: Self-pay | Admitting: Pharmacy Technician

## 2012-04-15 ENCOUNTER — Ambulatory Visit: Payer: BC Managed Care – PPO | Admitting: Gynecologic Oncology

## 2012-04-16 ENCOUNTER — Encounter (HOSPITAL_COMMUNITY): Payer: Self-pay

## 2012-04-16 ENCOUNTER — Encounter (HOSPITAL_COMMUNITY)
Admission: RE | Admit: 2012-04-16 | Discharge: 2012-04-16 | Disposition: A | Discharge status: Home / Self Care | Payer: BC Managed Care – PPO | Source: Ambulatory Visit | Attending: Obstetrics & Gynecology | Admitting: Obstetrics & Gynecology

## 2012-04-16 HISTORY — DX: Gastro-esophageal reflux disease without esophagitis: K21.9

## 2012-04-16 HISTORY — DX: Allergy status to unspecified drugs, medicaments and biological substances: Z88.9

## 2012-04-16 LAB — COMPREHENSIVE METABOLIC PANEL
AST: 20 U/L (ref 0–37)
Albumin: 4 g/dL (ref 3.5–5.2)
Calcium: 9.5 mg/dL (ref 8.4–10.5)
Chloride: 100 mEq/L (ref 96–112)
Creatinine, Ser: 0.62 mg/dL (ref 0.50–1.10)
Total Protein: 7.4 g/dL (ref 6.0–8.3)

## 2012-04-16 LAB — CBC WITH DIFFERENTIAL/PLATELET
Basophils Absolute: 0 10*3/uL (ref 0.0–0.1)
Basophils Relative: 0 % (ref 0–1)
Eosinophils Absolute: 0 10*3/uL (ref 0.0–0.7)
Eosinophils Relative: 0 % (ref 0–5)
HCT: 41 % (ref 36.0–46.0)
MCH: 29.4 pg (ref 26.0–34.0)
MCHC: 34.4 g/dL (ref 30.0–36.0)
Monocytes Absolute: 0.3 10*3/uL (ref 0.1–1.0)
Neutro Abs: 3.8 10*3/uL (ref 1.7–7.7)
RDW: 14.3 % (ref 11.5–15.5)

## 2012-04-16 LAB — ABO/RH: ABO/RH(D): O NEG

## 2012-04-16 LAB — SURGICAL PCR SCREEN: Staphylococcus aureus: NEGATIVE

## 2012-04-16 NOTE — Pre-Procedure Instructions (Signed)
Chest x ray 9/13 on chart.  Instructed patient to call today and clarify if bowel prep is needed pre op

## 2012-04-16 NOTE — Patient Instructions (Addendum)
20 Pamela Luna  04/16/2012   Your procedure is scheduled on:  04/20/12   Tuesday  Surgery [id number]  Report to [facility] at   0730    AM.  Call this number if you have problems the morning of surgery: [phone]     Or PST   [id number]  [facility]   Remember:   Do not eat food   Or drink any fluids :After Midnight.   Monday NIGHT OR AS DIRECTED BY OFFICE FOR BOWEL PREP--- CALL OFFICE TODAY TO CLARIFY I(F NEEDED      Take these medicines the morning of surgery with A SIP OF WATER: PROLISEC                        MAY TAKE VICODIN, ZOFRAN  IF NEEDED   Do not wear jewelry, make-up or nail polish.  Do not wear lotions, powders, or perfumes. You may wear deodorant.  Do not shave 48 hours prior to surgery.  Do not bring valuables to the hospital.  Contacts, dentures or bridgework may not be worn into surgery.  Leave suitcase in the car. After surgery it may be brought to your room.  For patients admitted to the hospital, checkout time is 11:00 AM the day of discharge.   Patients discharged the day of surgery will not be allowed to drive home.  Name and phone number of your driver:   husband                                                                   Special Instructions: CHG Shower Use Special Wash:   REGULAR SOAP FACE AND PRIVATES              LADIES- NO SHAVING 48 HOURS BEFORE USING BETASEPT SOAP.                   Please read over the following fact sheets that you were given: MRSA Information

## 2012-04-20 ENCOUNTER — Encounter (HOSPITAL_COMMUNITY): Payer: Self-pay | Admitting: Anesthesiology

## 2012-04-20 ENCOUNTER — Ambulatory Visit (HOSPITAL_COMMUNITY)
Admission: RE | Admit: 2012-04-20 | Discharge: 2012-04-20 | Disposition: A | Discharge status: Home / Self Care | Payer: BC Managed Care – PPO | Source: Ambulatory Visit | Attending: Obstetrics & Gynecology | Admitting: Obstetrics & Gynecology

## 2012-04-20 ENCOUNTER — Encounter (HOSPITAL_COMMUNITY): Payer: Self-pay | Admitting: *Deleted

## 2012-04-20 ENCOUNTER — Encounter (HOSPITAL_COMMUNITY)
Admission: RE | Disposition: A | Discharge status: Home / Self Care | Payer: Self-pay | Source: Ambulatory Visit | Attending: Obstetrics & Gynecology

## 2012-04-20 ENCOUNTER — Ambulatory Visit (HOSPITAL_COMMUNITY): Payer: BC Managed Care – PPO | Admitting: Anesthesiology

## 2012-04-20 DIAGNOSIS — I10 Essential (primary) hypertension: Secondary | ICD-10-CM | POA: Insufficient documentation

## 2012-04-20 DIAGNOSIS — Z79899 Other long term (current) drug therapy: Secondary | ICD-10-CM | POA: Insufficient documentation

## 2012-04-20 DIAGNOSIS — N83209 Unspecified ovarian cyst, unspecified side: Secondary | ICD-10-CM | POA: Insufficient documentation

## 2012-04-20 DIAGNOSIS — N83202 Unspecified ovarian cyst, left side: Secondary | ICD-10-CM

## 2012-04-20 DIAGNOSIS — Z01812 Encounter for preprocedural laboratory examination: Secondary | ICD-10-CM | POA: Insufficient documentation

## 2012-04-20 DIAGNOSIS — D287 Benign neoplasm of other specified female genital organs: Secondary | ICD-10-CM | POA: Insufficient documentation

## 2012-04-20 HISTORY — PX: LEFT OOPHORECTOMY: SHX1961

## 2012-04-20 LAB — TYPE AND SCREEN
ABO/RH(D): O NEG
Antibody Screen: NEGATIVE

## 2012-04-20 SURGERY — ROBOTIC ASSISTED SALPINGO OOPHORECTOMY
Anesthesia: General | Laterality: Left | Wound class: Clean Contaminated

## 2012-04-20 MED ORDER — IBUPROFEN 800 MG PO TABS: 800.0000 mg | ORAL_TABLET | Freq: Three times a day (TID) | ORAL | Status: DC | PRN

## 2012-04-20 MED ORDER — STERILE WATER FOR IRRIGATION IR SOLN
Status: DC | PRN
  Administered 2012-04-20: 3000 mL

## 2012-04-20 MED ORDER — LACTATED RINGERS IV SOLN
INTRAVENOUS | Status: DC
  Administered 2012-04-20: 11:00:00 via INTRAVENOUS
  Administered 2012-04-20: 1000 mL via INTRAVENOUS

## 2012-04-20 MED ORDER — DEXAMETHASONE SODIUM PHOSPHATE 10 MG/ML IJ SOLN
INTRAMUSCULAR | Status: DC | PRN
  Administered 2012-04-20: 10 mg via INTRAVENOUS

## 2012-04-20 MED ORDER — SCOPOLAMINE 1 MG/3DAYS TD PT72
MEDICATED_PATCH | TRANSDERMAL | Status: DC | PRN
  Administered 2012-04-20: 1 via TRANSDERMAL

## 2012-04-20 MED ORDER — SUCCINYLCHOLINE CHLORIDE 20 MG/ML IJ SOLN
INTRAMUSCULAR | Status: DC | PRN
  Administered 2012-04-20: 80 mg via INTRAVENOUS

## 2012-04-20 MED ORDER — ACETAMINOPHEN 10 MG/ML IV SOLN
INTRAVENOUS | Status: DC | PRN
  Administered 2012-04-20: 1000 mg via INTRAVENOUS

## 2012-04-20 MED ORDER — PROPOFOL 10 MG/ML IV BOLUS
INTRAVENOUS | Status: DC | PRN
  Administered 2012-04-20: 150 mg via INTRAVENOUS

## 2012-04-20 MED ORDER — CISATRACURIUM BESYLATE (PF) 10 MG/5ML IV SOLN
INTRAVENOUS | Status: DC | PRN
  Administered 2012-04-20: 4 mg via INTRAVENOUS

## 2012-04-20 MED ORDER — LACTATED RINGERS IV SOLN
INTRAVENOUS | Status: DC | PRN
  Administered 2012-04-20: 1000 mL

## 2012-04-20 MED ORDER — GLYCOPYRROLATE 0.2 MG/ML IJ SOLN
INTRAMUSCULAR | Status: DC | PRN
  Administered 2012-04-20: .6 mg via INTRAVENOUS

## 2012-04-20 MED ORDER — OXYCODONE-ACETAMINOPHEN 5-325 MG PO TABS: 2.0000 | ORAL_TABLET | Freq: Four times a day (QID) | ORAL | Status: DC | PRN

## 2012-04-20 MED ORDER — LIDOCAINE HCL (CARDIAC) 20 MG/ML IV SOLN
INTRAVENOUS | Status: DC | PRN
  Administered 2012-04-20: 100 mg via INTRAVENOUS

## 2012-04-20 MED ORDER — HYDROMORPHONE HCL PF 1 MG/ML IJ SOLN
0.2500 mg | INTRAMUSCULAR | Status: DC | PRN
  Administered 2012-04-20: 0.5 mg via INTRAVENOUS

## 2012-04-20 MED ORDER — SUFENTANIL CITRATE 50 MCG/ML IV SOLN
INTRAVENOUS | Status: DC | PRN
  Administered 2012-04-20: 5 ug via INTRAVENOUS
  Administered 2012-04-20: 20 ug via INTRAVENOUS
  Administered 2012-04-20: 10 ug via INTRAVENOUS

## 2012-04-20 MED ORDER — PROMETHAZINE HCL 25 MG/ML IJ SOLN: 6.2500 mg | INTRAMUSCULAR | Status: DC | PRN

## 2012-04-20 MED ORDER — SCOPOLAMINE 1 MG/3DAYS TD PT72
MEDICATED_PATCH | TRANSDERMAL | Status: AC
  Filled 2012-04-20: qty 1

## 2012-04-20 MED ORDER — MIDAZOLAM HCL 5 MG/5ML IJ SOLN
INTRAMUSCULAR | Status: DC | PRN
  Administered 2012-04-20: 2 mg via INTRAVENOUS

## 2012-04-20 MED ORDER — HYDROMORPHONE HCL PF 1 MG/ML IJ SOLN
INTRAMUSCULAR | Status: AC
  Filled 2012-04-20: qty 1

## 2012-04-20 MED ORDER — NEOSTIGMINE METHYLSULFATE 1 MG/ML IJ SOLN
INTRAMUSCULAR | Status: DC | PRN
  Administered 2012-04-20: 4 mg via INTRAVENOUS

## 2012-04-20 MED ORDER — ONDANSETRON HCL 4 MG/2ML IJ SOLN
INTRAMUSCULAR | Status: DC | PRN
  Administered 2012-04-20: 4 mg via INTRAVENOUS

## 2012-04-20 MED ORDER — ACETAMINOPHEN 10 MG/ML IV SOLN
INTRAVENOUS | Status: AC
  Filled 2012-04-20: qty 100

## 2012-04-20 SURGICAL SUPPLY — 50 items
APL SKNCLS STERI-STRIP NONHPOA (GAUZE/BANDAGES/DRESSINGS) ×1
BAG SPEC RTRVL LRG 6X4 10 (ENDOMECHANICALS) ×1
BENZOIN TINCTURE PRP APPL 2/3 (GAUZE/BANDAGES/DRESSINGS) ×2 IMPLANT
CHLORAPREP W/TINT 26ML (MISCELLANEOUS) ×2 IMPLANT
CLOTH BEACON ORANGE TIMEOUT ST (SAFETY) ×2 IMPLANT
CORD HIGH FREQUENCY UNIPOLAR (ELECTROSURGICAL) ×2 IMPLANT
CORDS BIPOLAR (ELECTRODE) ×2 IMPLANT
COVER MAYO STAND STRL (DRAPES) ×2 IMPLANT
COVER SURGICAL LIGHT HANDLE (MISCELLANEOUS) ×2 IMPLANT
COVER TIP SHEARS 8 DVNC (MISCELLANEOUS) ×1 IMPLANT
COVER TIP SHEARS 8MM DA VINCI (MISCELLANEOUS) ×1
DECANTER SPIKE VIAL GLASS SM (MISCELLANEOUS) ×1 IMPLANT
DRAPE LG THREE QUARTER DISP (DRAPES) ×4 IMPLANT
DRAPE SURG IRRIG POUCH 19X23 (DRAPES) ×2 IMPLANT
DRAPE TABLE BACK 44X90 PK DISP (DRAPES) ×4 IMPLANT
DRAPE UTILITY XL STRL (DRAPES) ×2 IMPLANT
DRSG TEGADERM 6X8 (GAUZE/BANDAGES/DRESSINGS) ×4 IMPLANT
ELECT REM PT RETURN 9FT ADLT (ELECTROSURGICAL) ×2
ELECTRODE REM PT RTRN 9FT ADLT (ELECTROSURGICAL) ×1 IMPLANT
GAUZE VASELINE 3X9 (GAUZE/BANDAGES/DRESSINGS) IMPLANT
GLOVE BIO SURGEON STRL SZ 6.5 (GLOVE) ×8 IMPLANT
GLOVE BIO SURGEON STRL SZ7.5 (GLOVE) ×4 IMPLANT
GLOVE BIOGEL PI IND STRL 7.0 (GLOVE) ×2 IMPLANT
GLOVE BIOGEL PI INDICATOR 7.0 (GLOVE) ×2
GOWN PREVENTION PLUS XLARGE (GOWN DISPOSABLE) ×8 IMPLANT
GOWN STRL NON-REIN LRG LVL3 (GOWN DISPOSABLE) ×3 IMPLANT
HOLDER FOLEY CATH W/STRAP (MISCELLANEOUS) ×2 IMPLANT
KIT ACCESSORY DA VINCI DISP (KITS) ×1
KIT ACCESSORY DVNC DISP (KITS) ×1 IMPLANT
MANIPULATOR UTERINE 4.5 ZUMI (MISCELLANEOUS) ×2 IMPLANT
OCCLUDER COLPOPNEUMO (BALLOONS) ×2 IMPLANT
PACK LAPAROSCOPY W LONG (CUSTOM PROCEDURE TRAY) ×2 IMPLANT
POUCH SPECIMEN RETRIEVAL 10MM (ENDOMECHANICALS) ×3 IMPLANT
SET TUBE IRRIG SUCTION NO TIP (IRRIGATION / IRRIGATOR) ×2 IMPLANT
SHEET LAVH (DRAPES) ×2 IMPLANT
SOLUTION ELECTROLUBE (MISCELLANEOUS) ×2 IMPLANT
SPONGE LAP 18X18 X RAY DECT (DISPOSABLE) IMPLANT
STRIP CLOSURE SKIN 1/2X4 (GAUZE/BANDAGES/DRESSINGS) ×2 IMPLANT
SUT VIC AB 0 CT1 27 (SUTURE) ×2
SUT VIC AB 0 CT1 27XBRD ANTBC (SUTURE) ×3 IMPLANT
SUT VIC AB 4-0 PS2 27 (SUTURE) ×4 IMPLANT
SUT VICRYL 0 UR6 27IN ABS (SUTURE) ×4 IMPLANT
SYR 50ML LL SCALE MARK (SYRINGE) ×2 IMPLANT
SYR BULB IRRIGATION 50ML (SYRINGE) IMPLANT
TOWEL OR 17X26 10 PK STRL BLUE (TOWEL DISPOSABLE) ×2 IMPLANT
TOWEL OR NON WOVEN STRL DISP B (DISPOSABLE) ×2 IMPLANT
TRAP SPECIMEN MUCOUS 40CC (MISCELLANEOUS) ×2 IMPLANT
TRAY FOLEY CATH 14FRSI W/METER (CATHETERS) ×2 IMPLANT
TUBING FILTER THERMOFLATOR (ELECTROSURGICAL) IMPLANT
WATER STERILE IRR 1500ML POUR (IV SOLUTION) ×4 IMPLANT

## 2012-04-20 NOTE — Progress Notes (Signed)
Up to BR with help and voided well. Small amt vaginal bleeding

## 2012-04-20 NOTE — Anesthesia Preprocedure Evaluation (Signed)
Anesthesia Evaluation  Patient identified by MRN, date of birth, ID band Patient awake    Reviewed: Allergy & Precautions, H&P , NPO status , Patient's Chart, lab work & pertinent test results  Airway Mallampati: II TM Distance: >3 FB Neck ROM: Full    Dental No notable dental hx.    Pulmonary neg pulmonary ROS,  breath sounds clear to auscultation  Pulmonary exam normal       Cardiovascular hypertension, Pt. on medications + Valvular Problems/Murmurs Rhythm:Regular Rate:Normal     Neuro/Psych negative neurological ROS  negative psych ROS   GI/Hepatic Neg liver ROS, GERD-  Medicated,  Endo/Other  negative endocrine ROS  Renal/GU negative Renal ROS  negative genitourinary   Musculoskeletal negative musculoskeletal ROS (+)   Abdominal   Peds negative pediatric ROS (+)  Hematology negative hematology ROS (+)   Anesthesia Other Findings   Reproductive/Obstetrics negative OB ROS                           Anesthesia Physical Anesthesia Plan  ASA: II  Anesthesia Plan: General   Post-op Pain Management:    Induction: Intravenous  Airway Management Planned:   Additional Equipment:   Intra-op Plan:   Post-operative Plan: Extubation in OR  Informed Consent: I have reviewed the patients History and Physical, chart, labs and discussed the procedure including the risks, benefits and alternatives for the proposed anesthesia with the patient or authorized representative who has indicated his/her understanding and acceptance.   Dental advisory given  Plan Discussed with: CRNA  Anesthesia Plan Comments:         Anesthesia Quick Evaluation

## 2012-04-20 NOTE — Op Note (Signed)
PATIENT: Pamela Luna DATE OF BIRTH: 08/07/1960 ENCOUNTER DATE: 04/20/12   Preop Diagnosis: Left adnexal mass   Postoperative Diagnosis: Cystadenofibroma  Surgery: Robotic assisted Left salpingo-oophorectomy  Surgeons:  Jung Ingerson A. Duard Brady, MD; Antionette Char, MD   Assistant: Telford Nab   Anesthesia: General   Estimated blood loss: 25 ml   IVF: 1500 ml   Urine output: 125 ml   Complications: None   Pathology: Left fallopian tube and ovary  Operative findings: Normal uterus with adhesions to anterior abdominal wall.  Small omentum adherent to anterior abdominal wall. Frozen section as above. Normal appearing right adnexa and uterus.  Procedure: The patient was identified in the preoperative holding area. Informed consent was signed on the chart. Patient was seen history was reviewed and exam was performed.   The patient was then taken to the operating room and placed in the supine position with SCD hose on. General anesthesia was then induced without difficulty. She was then placed in the dorsolithotomy position. Her arms were tucked at her side with appropriate precautions on the gel pad. Shoulder blocks were then placed in the usual fashion with appropriate precautions. A OG-tube was placed to suction. First timeout was performed to confirm the patient, procedure, antibiotic need, allergy status, estimated estimated blood loss and OR time. The perineum was then prepped in the usual fashion with Betadine. A 14 French Foley was inserted into the bladder under sterile conditions. A sterile speculum was placed in the vagina. The cervix was without lesions. The cervix was grasped with a single-tooth tenaculum. The dilator without difficulty. A ZUMI with a medium Koe ring was placed without difficulty. The abdomen was then prepped with 1 Chlor prep sponge per protocol.   Patient was then draped after the prep was dried. Second timeout was performed to confirm the above. After again  confirming OG tube placement and it was to suction. A stab-wound was made in left upper quadrant 2 cm below the costal margin on the left in the midclavicular line. A 5 mm operative report was used to assure intra-abdominal placement. The abdomen was insufflated. At this point all points during the procedure the patient's intra-abdominal pressure was not increased over 15 mm of mercury. After insufflation was complete, the patient was placed in deep Trendelenburg position. 25 cm above the pubic symphysis that area was marked the camera port. Bilateral robotic ports were marked 10 cm from the midline incision at approximately 5 angle. Under direct visualization each of the trochars was placed into the abdomen. The small bowel was folded on its mesentery to allow visualization to the pelvis.   Abdominal survey as above.  The omentum was taken off of the abdominal wall with sharp dissection.  Pelvic washings were obtained and held. The retroperitoneum on the left was opened leaving the round ligament intact. The ureter was identified.  A window was made in between the ureter and the IP vessels. The IP was coagulated with bipolar and transected.  The utero-ovarian vessels on the left were skeletonized, coagulated with bipolar cautery and transected.  An endocatch bag was placed through the 10/12 camera port and the ovary was delivered.  It was sent for frozen section.  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  Based on pre-operative discussion of leaving other pelvic organs in situ if this lesion was benign, we concluded the procedure.  The washings were discarded.  The abdomen and pelvis were copiously irrigated and noted to be hemostatic. The robotic instruments were removed  under direct visualization as were the robotic trochars. The pneumoperitoneum was removed. The patient was then taken out of the Trendelenburg position. Using of 0 Vicryl on a UR 6 needle the midline port port fascia was  reapproximated. The skin was closed using 4-0 Vicryl. Steri-Strips and benzoin were applied.   All instrument needle and Ray-Tec counts were correct x2. The patient tolerated the procedure well and was taken to the recovery room in stable condition. This is Pamela Luna dictating an operative note on patient Pamela Luna.

## 2012-04-20 NOTE — H&P (View-Only) (Signed)
Consult Note: Gyn-Onc  Consult was requested by Dr. McComb for the evaluation of Pamela Luna 51 y.o. female  CC:  Chief Complaint  Patient presents with  . Ovarian Cyst    New Consult    HPI: G1P1 LMP 5 years ago.  Patient noted nausea and abdominal cramps on 03/06/2012.  Symptoms persisted, with sensation of warmth in the lower abdomen and back.  Patioent also reported poor appetite, denies vaginal discharge, no diarrhea but difficulty with defecation.  Laxatives have been necessary for BM since 03/06/2012.  Reports bloating for several years without any change, reports a feeling of heaviness and fullness.  IUD was removed 03/11/2012.  Seen in ER 03/20/2012 with c/o nausea and Dx constipation. She re-presented to Dr. McComb who completed an U/S that demonstrated a 2.6 x 1.9 x 2 cm left ovarian cysts with a 6mm solid component.  Right ovary 2.0x 1.6 cm simple cyst.  Has used laxatives but still feels abdominal discomfort and fatigue. She has some back pain and the abdominal discomfort may be decreasing.  On ultrasound her endometrial stripe was homogenous but 7.2 mm.  No bleeding.  U/S was on 04/01/12 and her IUD was removed 03/12/12.  Current Meds:  Outpatient Encounter Prescriptions as of 04/01/2012  Medication Sig Dispense Refill  . ibuprofen (ADVIL) 200 MG tablet Take 200 mg by mouth as needed.      . lisinopril-hydrochlorothiazide (PRINZIDE,ZESTORETIC) 20-25 MG per tablet Daily.      . Multiple Vitamins-Minerals (HAIR/SKIN/NAILS PO) Take by mouth as needed.      . ondansetron (ZOFRAN) 8 MG tablet Take 8 mg by mouth every 8 (eight) hours as needed.      . HYDROcodone-acetaminophen (VICODIN) 5-500 MG per tablet Take 1 tablet by mouth every 6 (six) hours as needed.      . omeprazole (PRILOSEC) 20 MG capsule as needed.      . promethazine (PHENERGAN) 25 MG tablet Take 1 tablet (25 mg total) by mouth every 6 (six) hours as needed for nausea.  8 tablet  0    Allergy: No Known Allergies  Social  Hx:   History   Social History  . Marital Status: Married    Spouse Name: N/A    Number of Children: N/A  . Years of Education: N/A   Occupational History  . Not on file.   Social History Main Topics  . Smoking status: Former Smoker    Quit date: 12/15/2001  . Smokeless tobacco: Not on file  . Alcohol Use: Yes     Rarely  . Drug Use: No  . Sexually Active: Yes   Other Topics Concern  . Not on file   Social History Narrative  . 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    Past Surgical Hx:  Past Surgical History  Procedure Date  . Tonsillectomy 1966  . Cesarean section 1998  . Scalp cyst excision 2006 - 2009    Past Medical Hx:  Past Medical History  Diagnosis Date  . FHx: migraine headaches   . Heart murmur     H/O   . Hypertension     Past Gynecological History:  G1P1 Menarche 11 menopause 2008, IUD x 5 years  No LMP recorded. Patient is not currently having periods (Reason: Perimenopausal). Recalls a remote abnormal pap test. C/S   Colonoscopy 5 years ago and a benign polyp was removed. (Dr. Rajesh Gupta) Mammogram 2012  Family Hx:  Family   History  Problem Relation Age of Onset  . Cancer Mother 71    History of Colon Cancer 2004  . Diabetes Father   . Arthritis Father   . Heart disease Father   . Cancer Maternal Uncle   . Cancer Maternal Grandmother   . Cancer Maternal Uncle   . Cancer Cousin    Maternal uncle colon cancer DX 70 years, maternal uncle lung cancer 70 years maternal GM stomach cancer at age 50.  Review of Systems:   Vitals:  Blood pressure 128/80, pulse 70, temperature 98.0 F, temperature source Oral, resp. rate 18, height 5' 7.17" (1.706 m), weight 167 lb   Physical Exam: WD in NAD Genito Urinary  Vulva/vagina: Normal external female genitalia.  No lesions. No discharge or bleeding.  Bladder/urethra:  No lesions or masses  Vagina: Atrophic without any lesion  Cervix: Normal appearing, no lesions.  After  obtaining her verbal consent an endometrial biopsy was performed.  Cervix was grasped with a single tooth tenaculum and bx done.  Uterus sounded to 9 cm. One pass with minimal tissue. She tolerated it well. No bleeding from the tenaculum sites.   Assessment/Plan:  [name]  is a 51 y.o.  year old with ultrasound findings that indicate a mural nodule measuring 6 mm in a left ovarian simple cyst that measures 2.6 cm. The right ovary has a 2.0 cm simple cyst. There is no evidence of free fluid within the pelvis.  CA 125 returned to value of 20.6.  Given the patient's symptoms and the ultrasound findings recommendation is for laparoscopy with left salpingo-oophorectomy. The patient is desirous of a bilateral salpingo-oophorectomy and hysterectomy  irrespective of whether or not the left adnexal mass is benign.  She was counseled regarding the risks of oophorectomy prior to age 61. She wishes to consider these surgical options. She is still weighing her surgical options and I, as did Dr. Brewster, counseled her to a more conservative approach. We will call her with the endometrial biopsy results.  If benign, would recommend USO only with frozen section.  If endometrial biopsy with cancer then complete hysterectomy with associated staging.  Pamela Kussman A. Ayuub Penley, MD 04/15/2012   

## 2012-04-20 NOTE — Anesthesia Postprocedure Evaluation (Signed)
  Anesthesia Post-op Note  Patient: Pamela Luna  Procedure(s) Performed: Procedure(s) (LRB): ROBOTIC ASSISTED SALPINGO OOPHERECTOMY (Left)  Patient Location: PACU  Anesthesia Type: General  Level of Consciousness: awake and alert   Airway and Oxygen Therapy: Patient Spontanous Breathing  Post-op Pain: mild  Post-op Assessment: Post-op Vital signs reviewed, Patient's Cardiovascular Status Stable, Respiratory Function Stable, Patent Airway and No signs of Nausea or vomiting  Post-op Vital Signs: stable  Complications: No apparent anesthesia complications

## 2012-04-20 NOTE — Anesthesia Procedure Notes (Signed)
Procedure Name: Intubation Date/Time: 04/20/2012 10:32 AM Performed by: Leroy Libman L Patient Re-evaluated:Patient Re-evaluated prior to inductionOxygen Delivery Method: Circle system utilized Preoxygenation: Pre-oxygenation with 100% oxygen Intubation Type: IV induction Ventilation: Mask ventilation without difficulty and Oral airway inserted - appropriate to patient size Laryngoscope Size: Hyacinth Meeker and 2 Grade View: Grade III Tube type: Oral Tube size: 7.5 mm Number of attempts: 3 Airway Equipment and Method: Bougie stylet Placement Confirmation: ETT inserted through vocal cords under direct vision,  breath sounds checked- equal and bilateral and positive ETCO2 Secured at: 21 cm Tube secured with: Tape Dental Injury: Teeth and Oropharynx as per pre-operative assessment  Difficulty Due To: Difficulty was anticipated and Difficult Airway- due to anterior larynx Future Recommendations: Recommend- induction with short-acting agent, and alternative techniques readily available

## 2012-04-20 NOTE — Transfer of Care (Signed)
Immediate Anesthesia Transfer of Care Note  Patient: Pamela Luna  Procedure(s) Performed: Procedure(s) (LRB) with comments: ROBOTIC ASSISTED SALPINGO OOPHERECTOMY (Left)  Patient Location: PACU  Anesthesia Type: General  Level of Consciousness: awake, alert  and oriented  Airway & Oxygen Therapy: Patient Spontanous Breathing and Patient connected to face mask oxygen  Post-op Assessment: Report given to PACU RN and Post -op Vital signs reviewed and stable  Post vital signs: Reviewed and stable  Complications: No apparent anesthesia complications

## 2012-04-20 NOTE — Interval H&P Note (Signed)
History and Physical Interval Note:  04/20/2012 9:31 AM  Pamela Luna  has presented today for surgery, with the diagnosis of Ovarian Cyst  The various methods of treatment have been discussed with the patient and family. After consideration of risks, benefits and other options for treatment, the patient has consented to  Procedure(s) (LRB) with comments: ROBOTIC ASSISTED SALPINGO OOPHERECTOMY (N/A) as a surgical intervention .  The patient's history has been reviewed, patient examined, no change in status, stable for surgery.  I have reviewed the patient's chart and labs.  Questions were answered to the patient's satisfaction.  Endometrial biopsy was negative with inactive endometrium and no hyperplasia or carcinoma.  Will proceed with planned Left salpingo-oophorectomy as U/S suggests the cyst is on the left side.  The mass will be sent for frozen.  If negative, the procedure will be complete at that time.  If there is any malignancy, we will proceed with hysterectomy, contralateral USO and appropriate staging.   Merl Guardino A.

## 2012-04-20 NOTE — Preoperative (Signed)
Beta Blockers   Reason not to administer Beta Blockers:Not Applicable 

## 2012-05-05 ENCOUNTER — Encounter: Payer: Self-pay | Admitting: Gynecologic Oncology

## 2012-05-05 ENCOUNTER — Ambulatory Visit: Payer: BC Managed Care – PPO | Attending: Gynecologic Oncology | Admitting: Gynecologic Oncology

## 2012-05-05 VITALS — BP 118/72 | HR 80 | Temp 98.2°F | Resp 16 | Ht 69.0 in | Wt 164.0 lb

## 2012-05-05 DIAGNOSIS — I10 Essential (primary) hypertension: Secondary | ICD-10-CM | POA: Insufficient documentation

## 2012-05-05 DIAGNOSIS — Z79899 Other long term (current) drug therapy: Secondary | ICD-10-CM | POA: Insufficient documentation

## 2012-05-05 DIAGNOSIS — Z09 Encounter for follow-up examination after completed treatment for conditions other than malignant neoplasm: Secondary | ICD-10-CM | POA: Insufficient documentation

## 2012-05-05 DIAGNOSIS — Z9079 Acquired absence of other genital organ(s): Secondary | ICD-10-CM | POA: Insufficient documentation

## 2012-05-05 DIAGNOSIS — N83202 Unspecified ovarian cyst, left side: Secondary | ICD-10-CM

## 2012-05-05 DIAGNOSIS — K219 Gastro-esophageal reflux disease without esophagitis: Secondary | ICD-10-CM | POA: Insufficient documentation

## 2012-05-05 NOTE — Patient Instructions (Signed)
Follow up with Dr. McComb as scheduled.  

## 2012-05-05 NOTE — Progress Notes (Signed)
Consult Note: Gyn-Onc  Pamela Luna 51 y.o. female  CC:  Chief Complaint  Patient presents with  . Ovarian Cyst    post-op    HPI: G1P1 LMP 5 years ago. Patient noted nausea and abdominal cramps on 03/06/2012. Symptoms persisted, with sensation of warmth in the lower abdomen and back. Patioent also reported poor appetite, denies vaginal discharge, no diarrhea but difficulty with defecation. Laxatives have been necessary for BM since 03/06/2012. Reports bloating for several years without any change, reports a feeling of heaviness and fullness. IUD was removed 03/11/2012. Seen in ER 03/20/2012 with c/o nausea and Dx constipation. She re-presented to Dr. Arelia Sneddon who completed an U/S that demonstrated a 2.6 x 1.9 x 2 cm left ovarian cysts with a 6mm solid component. Right ovary 2.0x 1.6 cm simple cyst.  Has used laxatives but still feels abdominal discomfort and fatigue. She had some back pain and the abdominal discomfort may be decreasing. On ultrasound her endometrial stripe was homogenous but 7.2 mm. No bleeding. U/S was on 04/01/12 and her IUD was removed 03/12/12. Endometrial biopsy was negative.  Interval History:  After obtaining appropriate consent the patient opted for having a left salpingo-oophorectomy. On September 24 she underwent a robotic-assisted left salpingo-oophorectomy. The operative findings included a normal uterus with adhesions to the abdominal wall. The small omentum adherent to the anterior abdominal wall. There is a normal appearing right adnexa and uterus. Frozen section was sent and was consistent with a cystadenofibroma. 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 She had an adjacent corpus luteum. There was benign fallopian tube. There is no atypia or malignancy identified. She comes in today for her postoperative visit. She states she's been doing fairly well. This last Thursday and Friday were "bad days". She has some nausea there is no  vomiting. She has felt much better and is been improved since Friday. She does have nausea with Percocet so she's only been taking her ibuprofen. She's eating well has had regular bowel movements. She does complain of some insomnia. She's been taking Tylenol PM for this. When she falls asleep she states her sleep through the night. She feels a little bit achy and crampy. Preoperatively this was all diagnosis she was had it having abdominal pain and nausea prior to her surgeries is difficult to ascertain whether or not some of the symptoms are her unresolved preoperative symptoms and the ovarian cyst was found incidentally not associated with her symptoms whatsoever. She states that this is a possibility and she is fearful that her symptoms will persist despite her surgery and removal of this adnexal mass.  Review of Systems  Current Meds:  Outpatient Encounter Prescriptions as of 05/05/2012  Medication Sig Dispense Refill  . diphenhydramine-acetaminophen (TYLENOL PM) 25-500 MG TABS Take 2 tablets by mouth at bedtime as needed.      Marland Kitchen ibuprofen (ADVIL,MOTRIN) 800 MG tablet Take 1 tablet (800 mg total) by mouth every 8 (eight) hours as needed for pain.  30 tablet  0  . lisinopril-hydrochlorothiazide (PRINZIDE,ZESTORETIC) 20-25 MG per tablet Take 1 tablet by mouth daily after breakfast.       . Nutritional Supplements (ESTROVEN MAXIMUM STRENGTH PO) Take 1 tablet by mouth daily.      Marland Kitchen omeprazole (PRILOSEC) 20 MG capsule Take 20 mg by mouth as needed.       . ondansetron (ZOFRAN-ODT) 8 MG disintegrating tablet Take 8 mg by mouth every 8 (eight) hours as needed. Nausea      .  oxyCODONE-acetaminophen (PERCOCET) 5-325 MG per tablet Take 2 tablets by mouth every 6 (six) hours as needed for pain.  30 tablet  0    Allergy: No Known Allergies  Social Hx:   History   Social History  . Marital Status: Married    Spouse Name: N/A    Number of Children: N/A  . Years of Education: N/A   Occupational  History  . Not on file.   Social History Main Topics  . Smoking status: Former Smoker    Quit date: 12/15/2001  . Smokeless tobacco: Never Used  . Alcohol Use: Yes     Rarely  . Drug Use: No  . Sexually Active: Yes   Other Topics Concern  . Not on file   Social History Narrative  . No narrative on file    Past Surgical Hx:  Past Surgical History  Procedure Date  . Tonsillectomy 1966  . Cesarean section 1998  . Scalp cyst excision 2006 - 2009  . Left oophorectomy 04/20/12    Past Medical Hx:  Past Medical History  Diagnosis Date  . FHx: migraine headaches   . Heart murmur     H/O   . Hypertension   . H/O seasonal allergies   . GERD (gastroesophageal reflux disease)     Family Hx:  Family History  Problem Relation Age of Onset  . Cancer Mother 86    History of Colon Cancer 2004  . Diabetes Father   . Arthritis Father   . Heart disease Father   . Cancer Maternal Uncle   . Cancer Maternal Grandmother   . Cancer Maternal Uncle   . Cancer Cousin     Vitals:  Blood pressure 118/72, pulse 80, temperature 98.2 F (36.8 C), temperature source Oral, resp. rate 16, height 5\' 9"  (1.753 m), weight 164 lb (74.39 kg), last menstrual period 04/09/2007.  Physical Exam: Well-nourished well-developed female in no acute distress.  Abdomen: Well-healed surgical incisions. Abdomen is soft and nontender.  Assessment/Plan:  51 year old with a, nausea abdominal cramping in August. She presented to the emergency room and was followed up with Dr. Arelia Sneddon. Ultrasound revealed a 2.6 x 1.9 x 2 cm left ovarian cyst with a 6 mm solid component. Feeling that some of her symptoms may be attributed to this and she underwent a robotic left salpingo-oophorectomy. Fortunately her pathology is benign. She's doing fairly well postoperatively. She will continue to increase her activity level as tolerated. She'll contact us if there's any other issues. She will followup with her primary  gynecologist for her routine GYN care and mammogram. She does understand that this ovarian cyst may have been found incidentally in her symptoms of nausea on may not be completely resolved postoperatively. If this is the case she will followup with her primary care physician. She will return to see [place] prn. Remmie Bembenek A., MD 05/05/2012, 8:26 AM

## 2012-06-30 HISTORY — PX: ESOPHAGOGASTRODUODENOSCOPY: SHX1529

## 2012-09-11 ENCOUNTER — Other Ambulatory Visit: Payer: Self-pay

## 2013-06-02 ENCOUNTER — Other Ambulatory Visit: Payer: Self-pay

## 2013-10-12 ENCOUNTER — Other Ambulatory Visit (HOSPITAL_COMMUNITY): Payer: Self-pay | Admitting: Obstetrics and Gynecology

## 2013-10-12 DIAGNOSIS — Z8639 Personal history of other endocrine, nutritional and metabolic disease: Secondary | ICD-10-CM

## 2013-10-17 ENCOUNTER — Ambulatory Visit (HOSPITAL_COMMUNITY)
Admission: RE | Admit: 2013-10-17 | Discharge: 2013-10-17 | Disposition: A | Discharge status: Home / Self Care | Payer: BC Managed Care – PPO | Source: Ambulatory Visit | Attending: Obstetrics and Gynecology | Admitting: Obstetrics and Gynecology

## 2013-10-17 DIAGNOSIS — E041 Nontoxic single thyroid nodule: Secondary | ICD-10-CM | POA: Diagnosis present

## 2013-10-17 DIAGNOSIS — Z8639 Personal history of other endocrine, nutritional and metabolic disease: Secondary | ICD-10-CM

## 2013-10-17 DIAGNOSIS — E042 Nontoxic multinodular goiter: Secondary | ICD-10-CM | POA: Insufficient documentation

## 2013-10-19 ENCOUNTER — Other Ambulatory Visit: Payer: Self-pay | Admitting: Obstetrics and Gynecology

## 2013-10-19 DIAGNOSIS — E041 Nontoxic single thyroid nodule: Secondary | ICD-10-CM

## 2013-10-25 ENCOUNTER — Ambulatory Visit
Admission: RE | Admit: 2013-10-25 | Discharge: 2013-10-25 | Disposition: A | Discharge status: Home / Self Care | Payer: BC Managed Care – PPO | Source: Ambulatory Visit | Attending: Obstetrics and Gynecology | Admitting: Obstetrics and Gynecology

## 2013-10-25 ENCOUNTER — Other Ambulatory Visit (HOSPITAL_COMMUNITY)
Admission: RE | Admit: 2013-10-25 | Discharge: 2013-10-25 | Disposition: A | Discharge status: Home / Self Care | Payer: BC Managed Care – PPO | Source: Ambulatory Visit | Attending: Diagnostic Radiology | Admitting: Diagnostic Radiology

## 2013-10-25 DIAGNOSIS — E041 Nontoxic single thyroid nodule: Secondary | ICD-10-CM | POA: Insufficient documentation

## 2013-11-02 ENCOUNTER — Ambulatory Visit (INDEPENDENT_AMBULATORY_CARE_PROVIDER_SITE_OTHER): Payer: BC Managed Care – PPO | Admitting: Surgery

## 2013-11-02 ENCOUNTER — Encounter (INDEPENDENT_AMBULATORY_CARE_PROVIDER_SITE_OTHER): Payer: Self-pay | Admitting: Surgery

## 2013-11-02 VITALS — BP 122/80 | HR 77 | Temp 98.3°F | Resp 16 | Ht 67.0 in | Wt 177.6 lb

## 2013-11-02 DIAGNOSIS — E041 Nontoxic single thyroid nodule: Secondary | ICD-10-CM

## 2013-11-02 NOTE — Progress Notes (Signed)
General Surgery [facility]  Chief Complaint  Patient presents with  . Follow-up    bilateral thyroid nodules - referral from Dr. [name]    HISTORY: Patient is a 53 year old female previously evaluated in my practice for thyroid nodules. She was last seen in 2013.  Patient was seen by Dr. [name] one month ago. Her thyroid gland was noted to be mildly enlarged. It had been over 2 years since her last ultrasound. Ultrasound examination was obtained and demonstrated multiple bilateral thyroid nodules. The largest nodule was in the left inferior pole and measured 1.6 cm in maximum diameter, increased from her previous measurement of 1.2 cm. Patient underwent ultrasound-guided fine-needle aspiration biopsy on 10/25/2013. This showed findings consistent with nonneoplastic goiter. There was no evidence of atypia or malignancy.  No recent thyroid function test are available.  PERTINENT REVIEW OF SYSTEMS: Denies compressive symptoms. Minor dysphagia with pills. Denies tremor. Denies palpitations. Complains of dry skin.  EXAM: HEENT: normocephalic; pupils equal and reactive; sclerae clear; dentition good; mucous membranes moist NECK:  Thyroid gland is firm to palpation, slightly nodular bilaterally, without discrete or dominant mass palpable; symmetric on extension; no palpable anterior or posterior cervical lymphadenopathy; no supraclavicular masses; no tenderness CHEST: clear to auscultation bilaterally without rales, rhonchi, or wheezes CARDIAC: regular rate and rhythm without significant murmur; peripheral pulses are full EXT:  non-tender without edema; no deformity NEURO: no gross focal deficits; no sign of tremor   IMPRESSION: Bilateral thyroid nodules with benign cytopathology  PLAN: Patient and I reviewed the above studies in detail. I provided her copies for her records at home.  We will obtain a TSH level, a total T3 level, and a total T4 level. If the  patient is tending towards hypothyroidism, she may benefit from thyroid hormone supplementation. She and I discussed this at length today.  There is no indication for surgical intervention at this point.  Patient will return in 1 year for physical examination. We will repeat a thyroid ultrasound and a TSH level prior to that office visit.  Earnstine Regal, MD, [facility] Office: [phone]  Visit Diagnoses: 1. Thyroid nodule, left lobe

## 2013-11-02 NOTE — Patient Instructions (Signed)

## 2013-11-08 ENCOUNTER — Encounter (INDEPENDENT_AMBULATORY_CARE_PROVIDER_SITE_OTHER): Payer: Self-pay | Admitting: Surgery

## 2013-11-11 NOTE — Telephone Encounter (Signed)
I have left msg for pt to call office. We finally received the pts lab results. Pts TSH 2.659,T3 117.9,T4 8.9  are all within normal limits. I will send these labs to Dr Harlow Asa to review and call the pt with any recommendations.

## 2013-11-30 IMAGING — US US TRANSVAGINAL NON-OB
1 series · 13 of 25 positions shown · non-contrast
Comparison: None available

***ADDENDUM*** CREATED: 04/02/2012 [DATE]

Per discussion with Victoria Aca, RN, the patient is recently
postmenopausal.  Given the lack of vaginal bleeding and the
homogeneous appearance of the endometrial stripe, endometrial
biopsy is not felt to be necessary.
CLINICAL DATA: Cyst identified on office ultrasound.
Postmenopausal.
TRANSABDOMINAL AND TRANSVAGINAL ULTRASOUND OF PELVIS
TECHNIQUE: Both transabdominal and transvaginal ultrasound
examinations of the pelvis were performed. Transabdominal technique
was performed for global imaging of the pelvis including uterus,
ovaries, adnexal regions, and pelvic cul-de-sac.
It was necessary to proceed with endovaginal exam following the
transabdominal exam to visualize the uterus, endometrium, ovaries.

[Series 1: us transvaginal non-ob · 0.30mm/px · 13 of 49 slices shown]
[im 1/49]
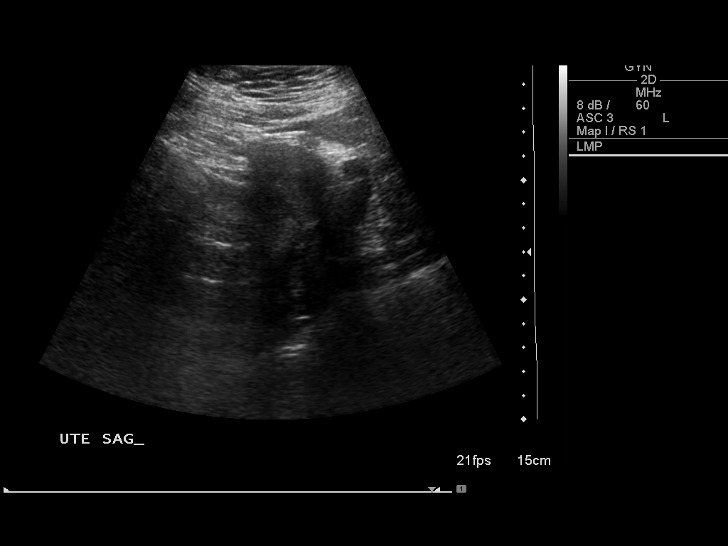
[im 5/49]
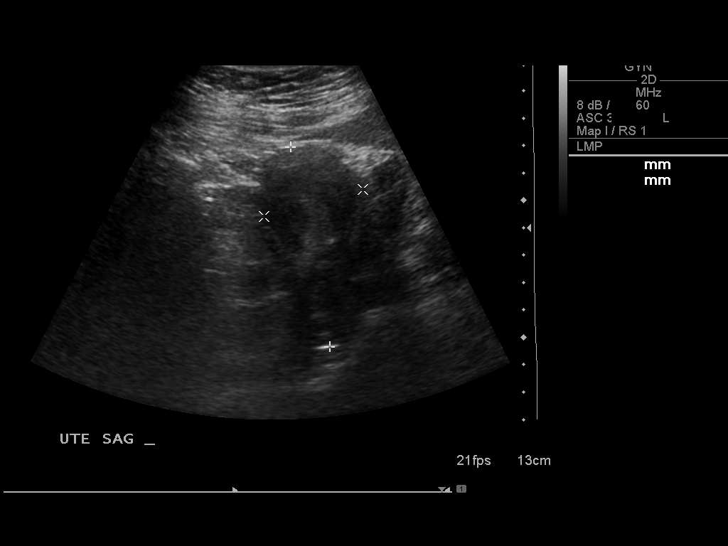
[im 9/49]
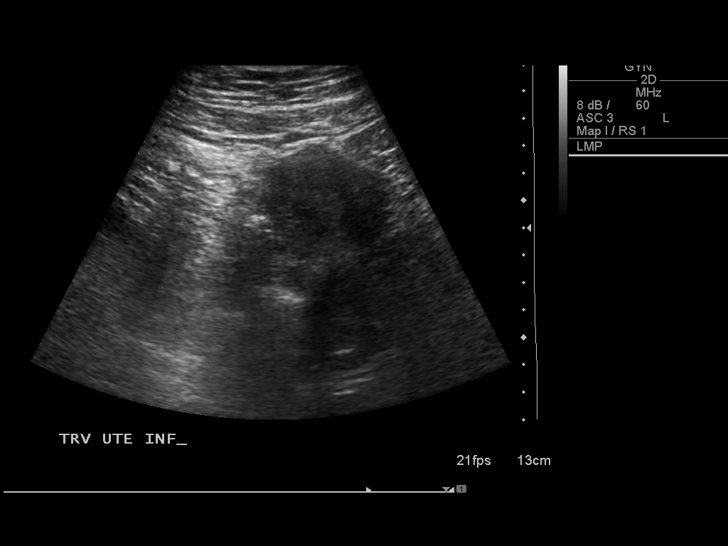
[im 13/49]
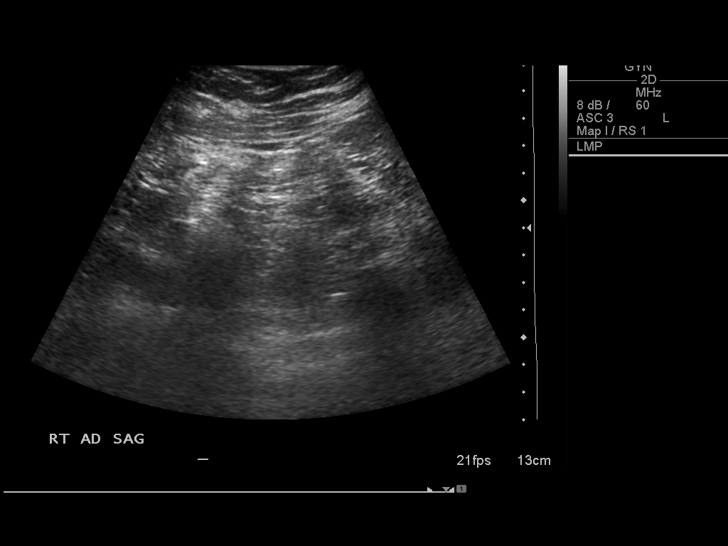
[im 17/49]
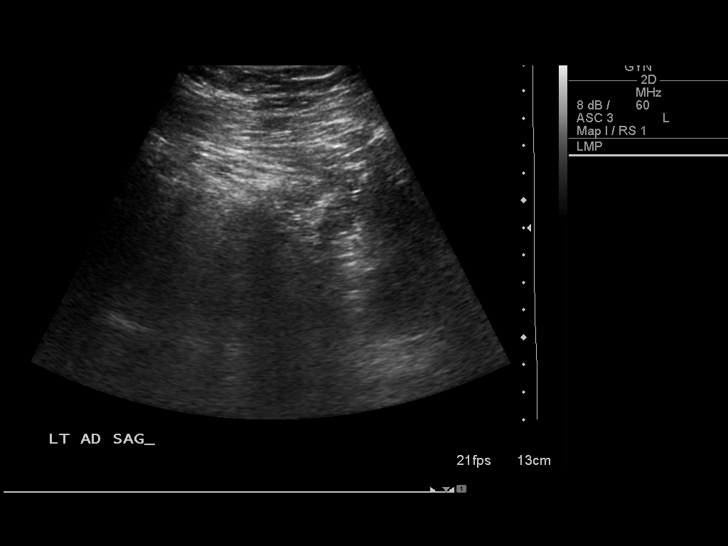
[im 21/49]
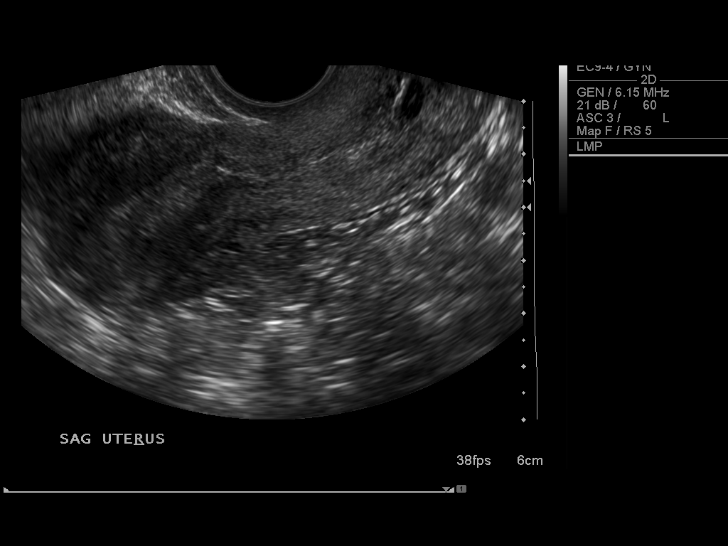
[im 25/49]
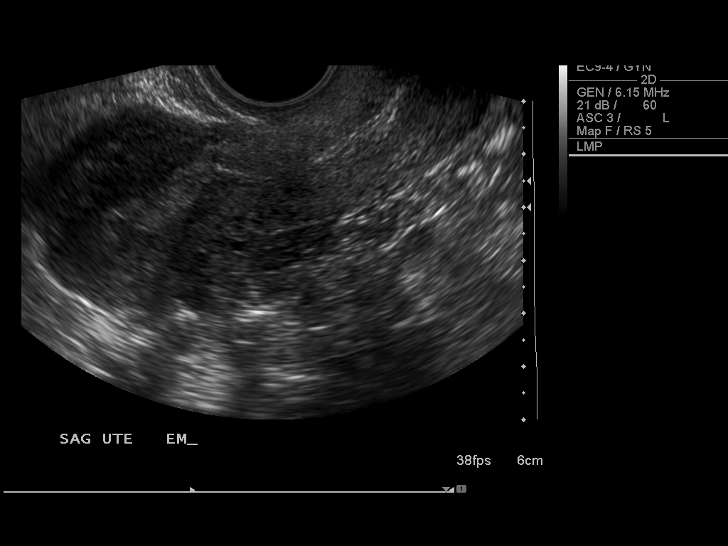
[im 29/49]
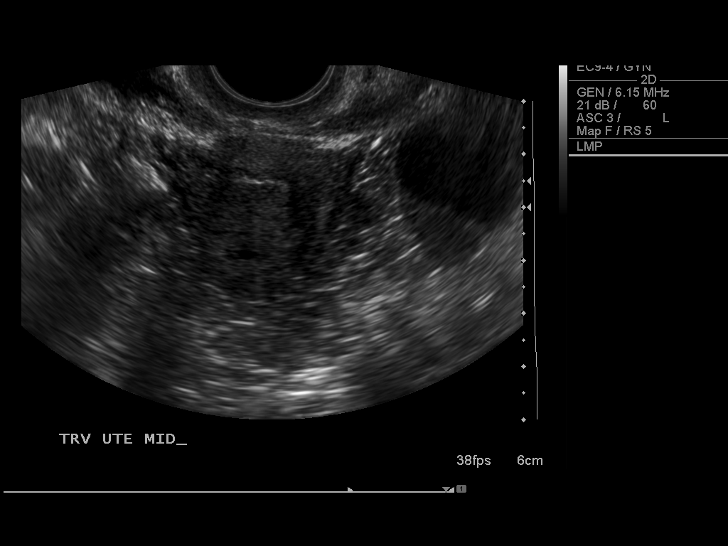
[im 33/49]
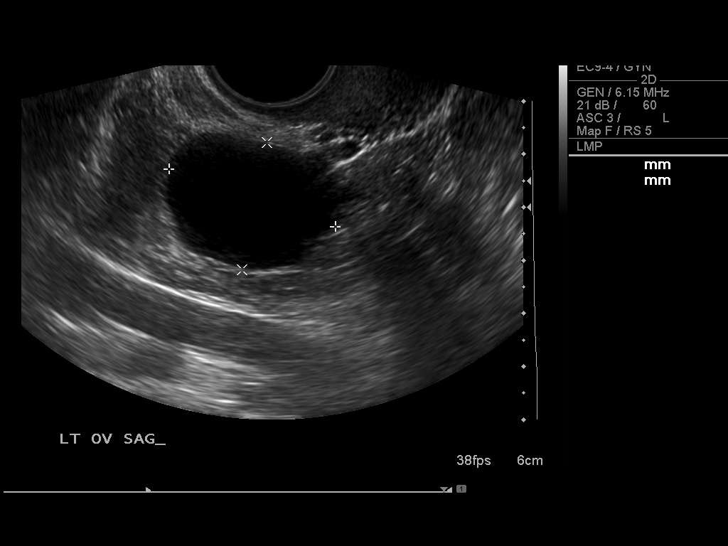
[im 37/49]
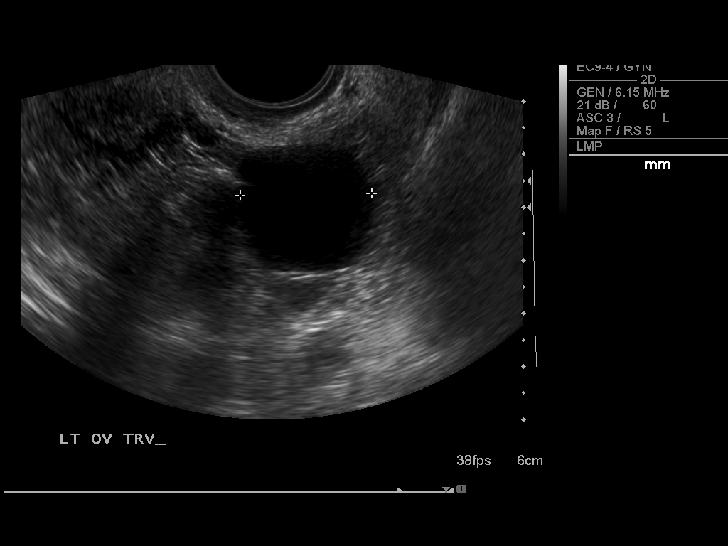
[im 41/49]
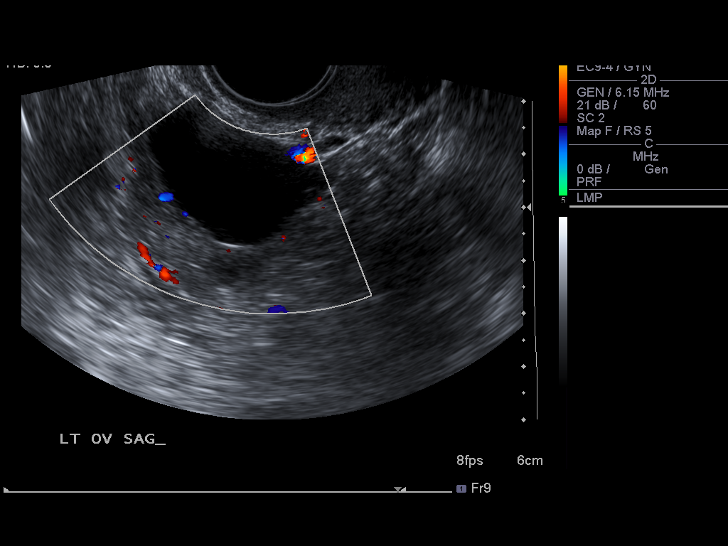
[im 45/49]
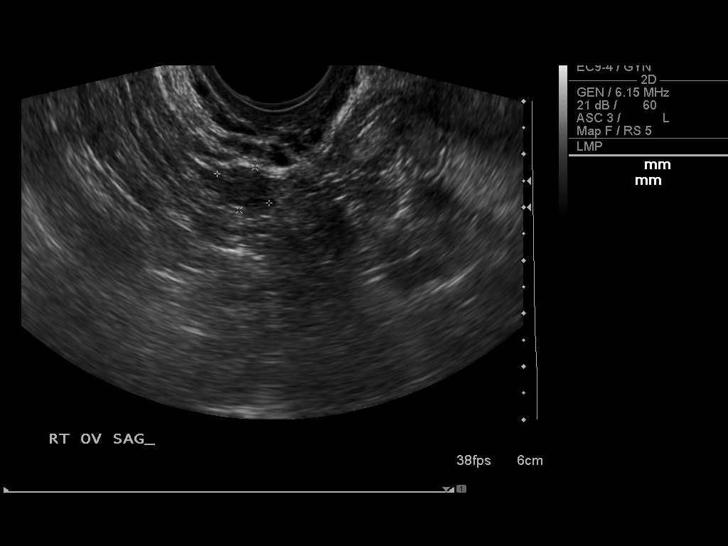
[im 49/49]
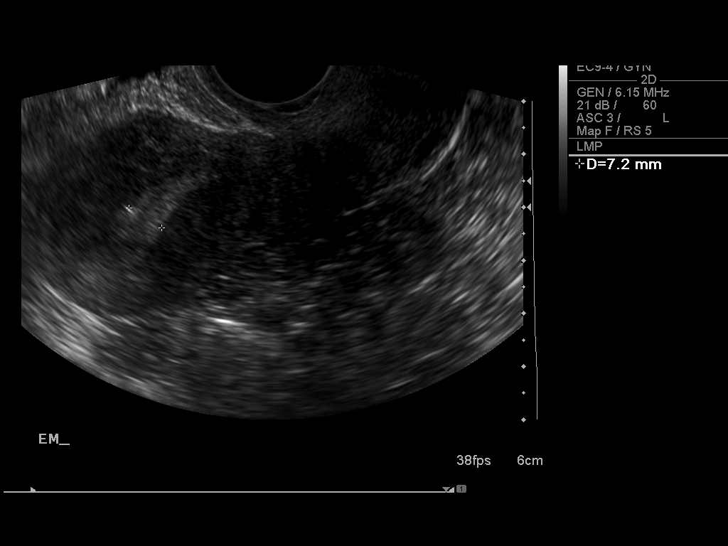

[13 of 25 positions shown; findings below may reference images not displayed]

FINDINGS: Uterus: The uterus is 7.4 x 3.7 x 4.8 cm.  Uterus is mid position
without focal mass.

Endometrium: 7.2 mm, normal in appearance.

Right ovary:  1.1 x 0.9 x 1.3 cm.  Normal appearance.

Left ovary: 3.8 x 2.8 x 3.3 cm.  Within the left ovary, there is a
cystic mass measuring 3.3 x 2.4 x 2.5 cm.  Along the lateral aspect
of this mass, there is a mural nodule. Nodule does not appear to
contain internal blood flow on Doppler evaluation.

Other findings: There is no free pelvic fluid.
IMPRESSION: 1.  Left ovarian cystic lesion which contains a mural nodule.
Presence of a solid nodule suggests possible neoplasm. By the
reported history, previous ultrasound showed a cyst approximately 6
weeks ago.  Therefore, findings are less likely related to a
physiologic process. Consider MRI or surgical evaluation.
2.  Normal-appearing right ovary and uterus..

## 2014-01-10 ENCOUNTER — Encounter (INDEPENDENT_AMBULATORY_CARE_PROVIDER_SITE_OTHER): Payer: Self-pay

## 2014-05-29 ENCOUNTER — Encounter (INDEPENDENT_AMBULATORY_CARE_PROVIDER_SITE_OTHER): Payer: Self-pay | Admitting: Surgery

## 2014-11-21 ENCOUNTER — Other Ambulatory Visit: Payer: Self-pay | Admitting: Surgery

## 2014-11-21 DIAGNOSIS — E042 Nontoxic multinodular goiter: Secondary | ICD-10-CM

## 2014-11-22 ENCOUNTER — Other Ambulatory Visit: Payer: Self-pay | Admitting: Surgery

## 2014-11-22 DIAGNOSIS — E042 Nontoxic multinodular goiter: Secondary | ICD-10-CM

## 2014-11-23 ENCOUNTER — Other Ambulatory Visit: Payer: Self-pay | Admitting: Obstetrics and Gynecology

## 2014-11-24 ENCOUNTER — Ambulatory Visit
Admission: RE | Admit: 2014-11-24 | Discharge: 2014-11-24 | Disposition: A | Discharge status: Home / Self Care | Payer: BC Managed Care – PPO | Source: Ambulatory Visit | Attending: Surgery | Admitting: Surgery

## 2014-11-24 LAB — CYTOLOGY - PAP

## 2015-06-17 IMAGING — US US SOFT TISSUE HEAD/NECK
1 series · 13 of 25 positions shown · non-contrast
Comparison: US SOFT TISSUE HEAD/NECK dated 11/04/2011; US SOFT TISSUE
HEAD/NECK dated 04/23/2011

CLINICAL DATA: Follow-up of known left lobe thyroid nodule

EXAM:
THYROID ULTRASOUND
TECHNIQUE: Ultrasound examination of the thyroid gland and adjacent soft
tissues was performed.

[Series 1: us soft tissue head/neck · 0.07mm/px · 13 of 49 slices shown]
[im 1/49]
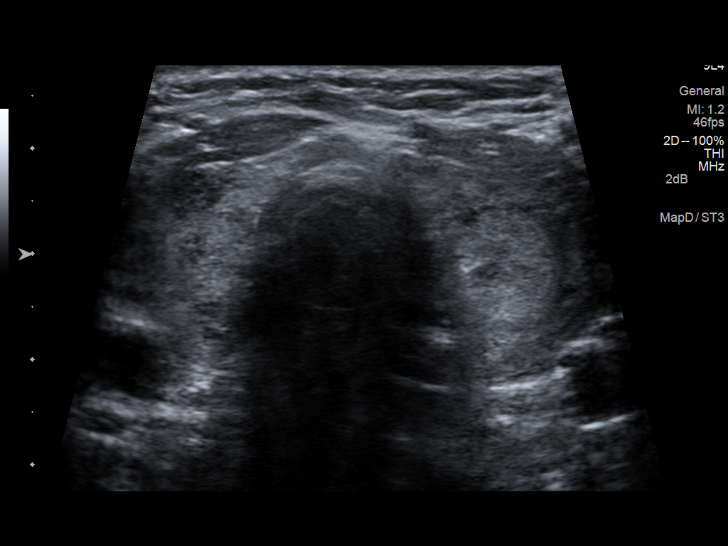
[im 5/49]
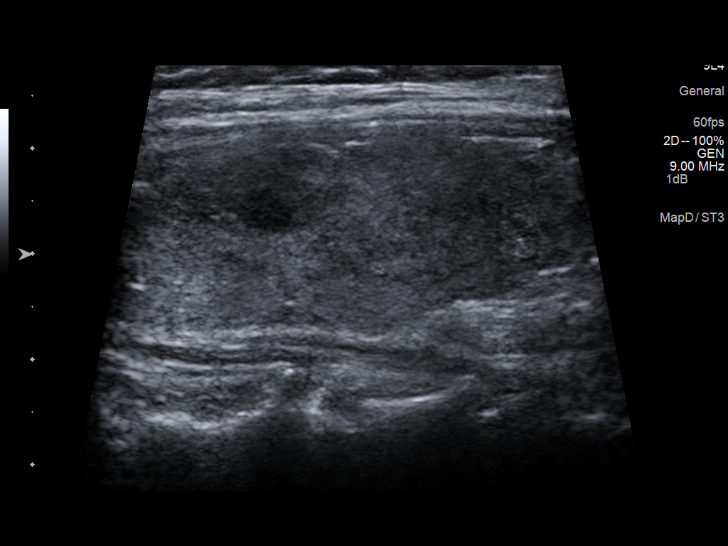
[im 9/49]
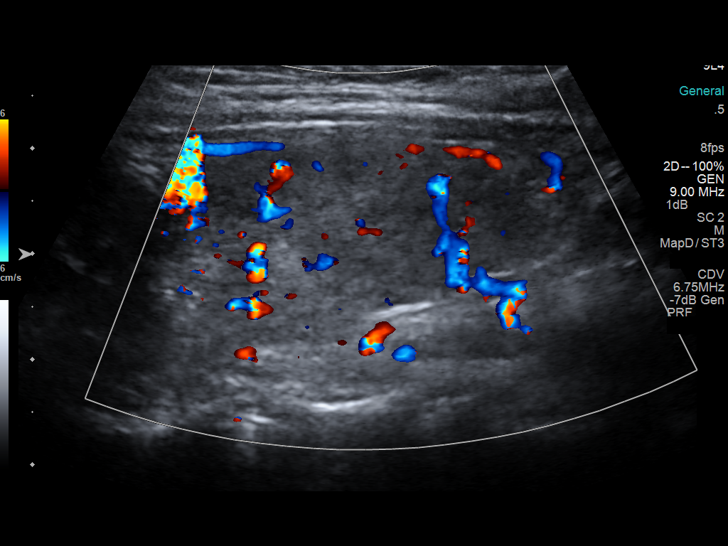
[im 13/49]
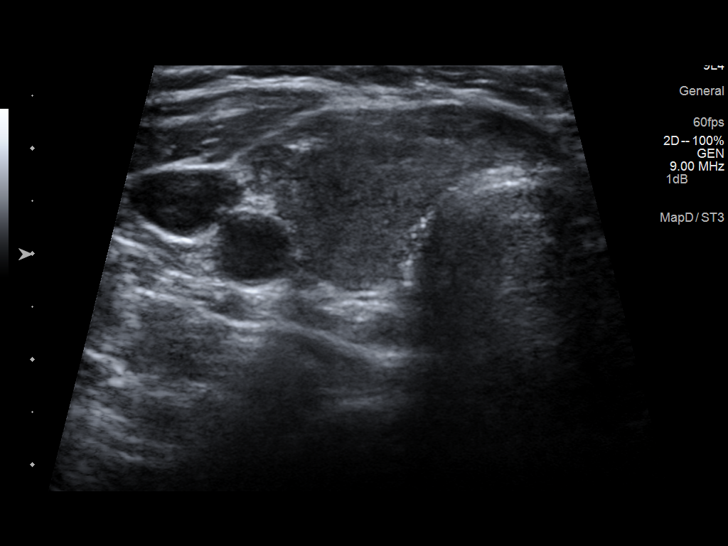
[im 17/49]
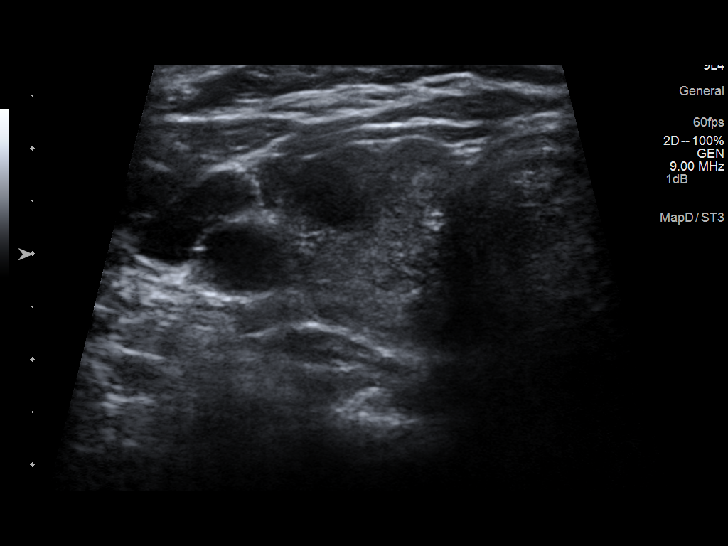
[im 21/49]
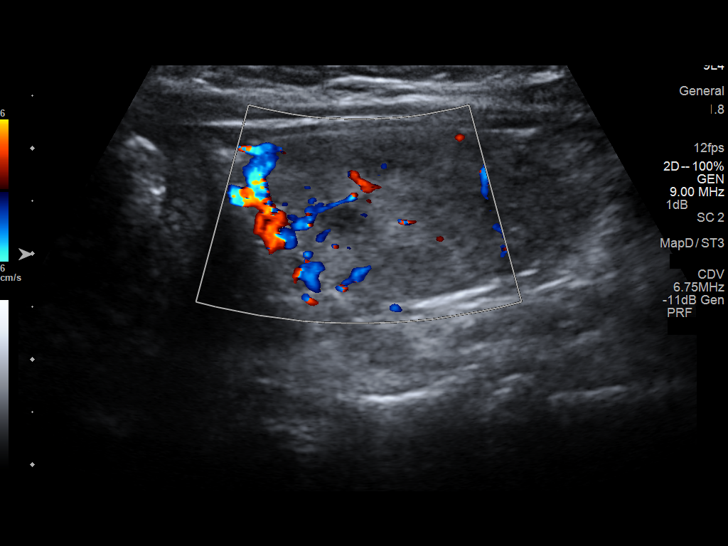
[im 25/49]
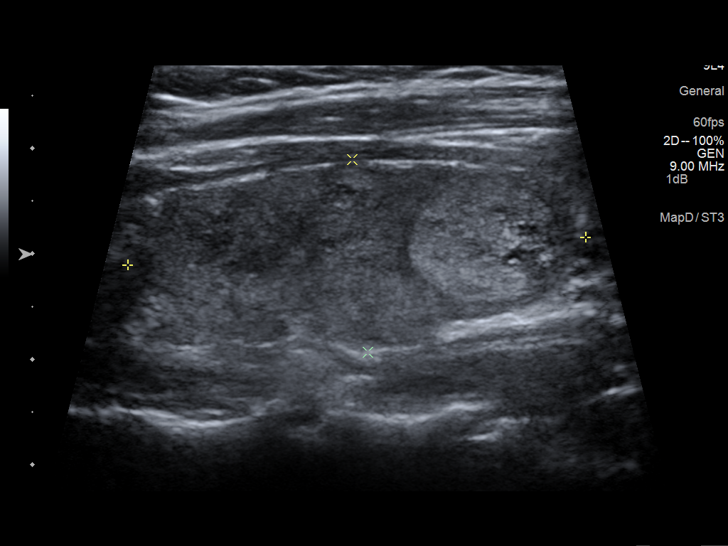
[im 29/49]
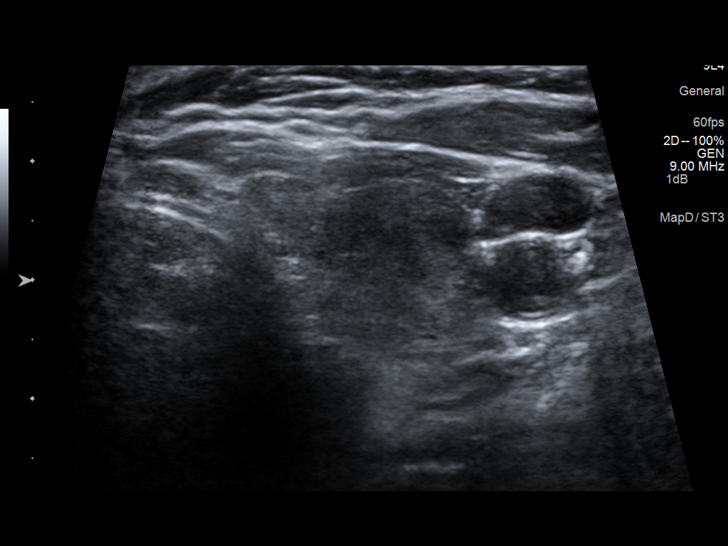
[im 33/49]
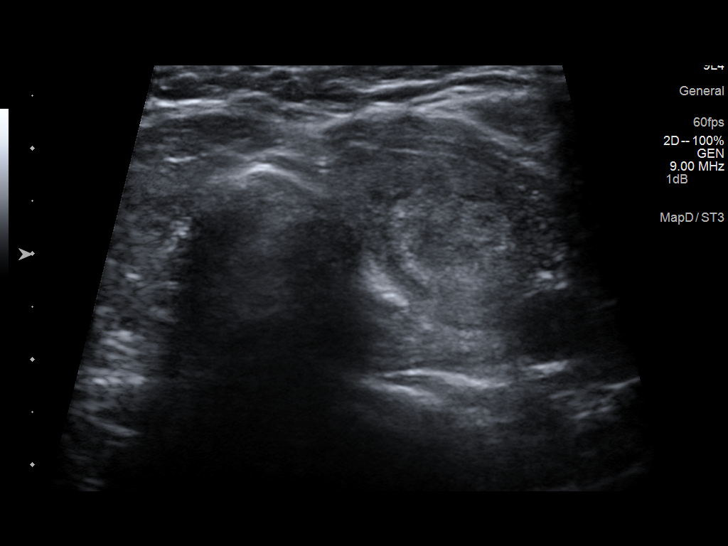
[im 37/49]
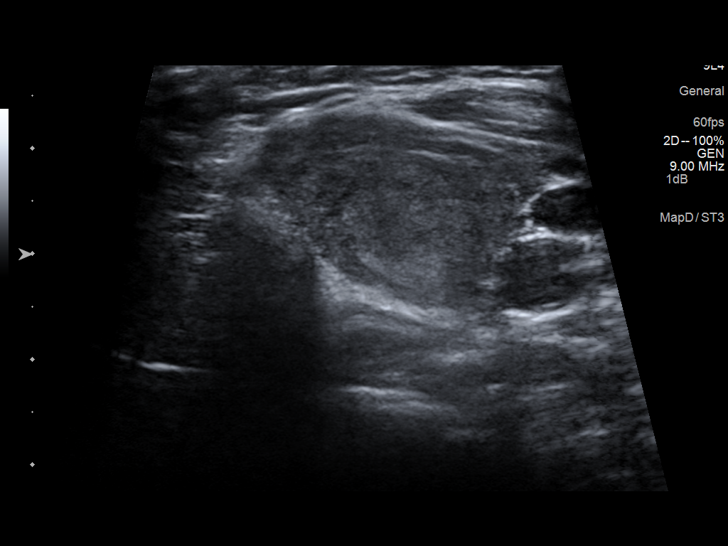
[im 41/49]
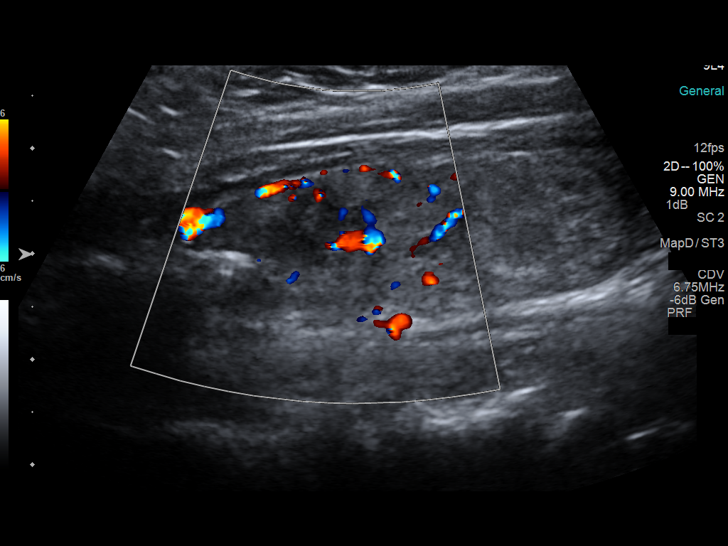
[im 45/49]
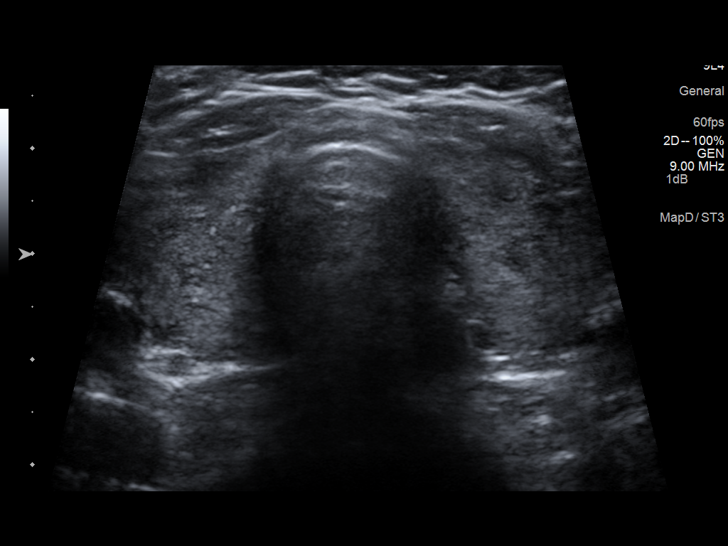
[im 49/49]
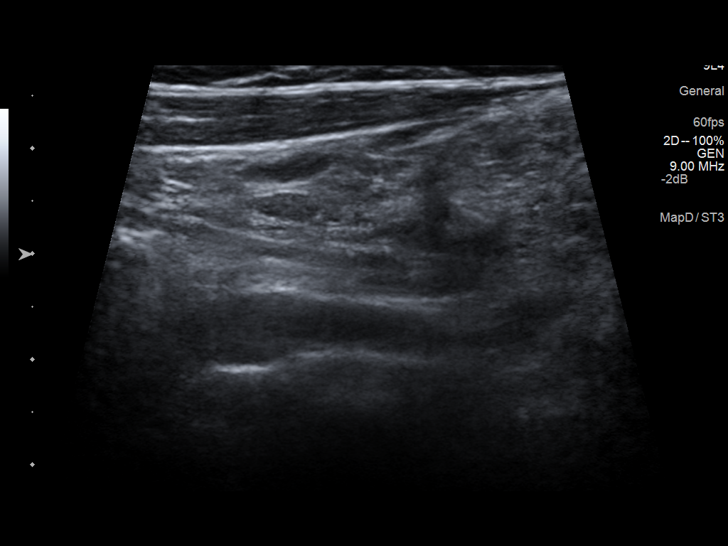

[13 of 25 positions shown; findings below may reference images not displayed]

FINDINGS: Right thyroid lobe

Measurements: 4.6 x 1.9 x 1.7 cm and exhibits a smaller longitudinal
dimension than on the previous study. In the upper pole there is a 1
x 0.9 x 1 cm diameter hypoechoic solid-appearing nodule. In the
midpole there is a 0.5 x 0.3 x 0.3 cm diameter hypoechoic nodule.

Left thyroid lobe

Measurements: 4.4 x 1.8 x 1.7 cm and is stable. In the upper pole
there is a 0.8 x 0.8 x 1 cm diameter hypoechoic nodule. In the lower
pole there is a 1.6 x 1.3 x 1.3 cm diameter hyperechoic nodule. This
has increased in size since October 2011 at which time it measured
x 1.0 x 1.2 cm.

Isthmus

Thickness: 0.3 cm.  No nodules visualized.

Lymphadenopathy

None visualized.
IMPRESSION: 1. The known solid hyperechoic nodule in the lower pole of the left
thyroid lobe has increased in size since the previous studies. Given
that there has been interval growth, biopsy is recommended. Findings
meet consensus criteria for biopsy. Ultrasound-guided fine needle
aspiration should be considered, as per the consensus statement:
Management of Thyroid Nodules Detected at US: Society of
Radiologists in Ultrasound Consensus Conference Statement. Radiology
3998; [DATE].
2. Also visible today are hypoechoic nodules in both thyroid lobes
that have not been previously described.

## 2017-01-23 ENCOUNTER — Other Ambulatory Visit: Payer: Self-pay | Admitting: Surgery

## 2017-01-23 DIAGNOSIS — E042 Nontoxic multinodular goiter: Secondary | ICD-10-CM

## 2017-01-27 ENCOUNTER — Ambulatory Visit
Admission: RE | Admit: 2017-01-27 | Discharge: 2017-01-27 | Disposition: A | Discharge status: Home / Self Care | Payer: BC Managed Care – PPO | Source: Ambulatory Visit | Attending: Surgery | Admitting: Surgery

## 2017-01-27 DIAGNOSIS — E042 Nontoxic multinodular goiter: Secondary | ICD-10-CM

## 2018-08-11 ENCOUNTER — Encounter: Payer: Self-pay | Admitting: Hematology

## 2018-08-11 ENCOUNTER — Telehealth: Payer: Self-pay | Admitting: Hematology

## 2018-08-11 NOTE — Telephone Encounter (Signed)
A new high risk appt has been scheduled to see Dr. Burr Medico on 3/2 at 215pm. Pt aware to arrive 30 minutes early. Letter mailed.

## 2018-09-24 ENCOUNTER — Other Ambulatory Visit: Payer: Self-pay | Admitting: Surgery

## 2018-09-24 DIAGNOSIS — E042 Nontoxic multinodular goiter: Secondary | ICD-10-CM

## 2018-09-24 NOTE — Progress Notes (Signed)
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 Radene Knee  HISTORY OF PRESENTING ILLNESS:  Pamela Luna 58 y.o. 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 The patient was referred by her Gynecologist. The patient presents to the clinic today by herself.   Today the patient notes she feels well and denies any chest or GI issues. She notes having chronic constipating but she uses ducalax to manage this. She has been stressed lately due to recent passing of her mother last year. Her best friend just died recently as well.   Socially she is married with 1 child. 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 She quit smoked from [age] years old on and off. She stopped in 2003. She barely drinks and does not do recreational drugs.  They have a PMHx of HTN. She is on lisinopril-HCTZ. She also has high cholesterol. She is on Pravastatin twice a week. She has acid reflux, on Prilosec. She notes having 4 nodules on her thyroid and this will be biopsied soon. She is s/o left oophorectomy from left ovarian cyst. She has hot flashes and uses Effexor to help. She had a C-section in 1998. She has restless leg and takes Advil nightly.   Her mother had colon cancer, her maternal uncles had colon cancer and her maternal grandmother had stomach cancer. Her maternal great-grandmother had breast cancer and her meternal cousin had breast cancer. Her paternal cousin had bladder cancer. She has a colonosocpy in 03/2017 by Dr. Lyndel Safe     REVIEW OF SYSTEMS:    Constitutional: Denies fevers, chills or abnormal night sweats Eyes: Denies blurriness of vision, double vision  or watery eyes Ears, nose, mouth, throat, and face: Denies mucositis or sore throat Respiratory: Denies cough, dyspnea or wheezes Cardiovascular: Denies palpitation, chest discomfort or lower extremity swelling Gastrointestinal:  Denies nausea, heartburn (+) chronic constipation, manageable  Skin: Denies abnormal skin rashes MSK: (+) Restless leg  Lymphatics: Denies new lymphadenopathy or easy bruising Neurological:Denies numbness, tingling or new weaknesses Behavioral/Psych: Mood is stable, no new changes  All other systems were reviewed with the patient and are negative.   MEDICAL HISTORY:  Past Medical History:  Diagnosis Date  . FHx: migraine headaches   . GERD (gastroesophageal reflux disease)   . H/O seasonal allergies   . Heart murmur    H/O   . Hypertension     SURGICAL HISTORY: Past Surgical History:  Procedure Laterality Date  . CESAREAN SECTION  1998  . LEFT OOPHORECTOMY  04/20/12  . SCALP CYST EXCISION  2006 - 2009  . TONSILLECTOMY  1966    SOCIAL HISTORY: Social History   Socioeconomic History  . Marital status: Married    Spouse name: Not on file  . Number of children: 1  . Years of education: Not on file  . Highest education level: Not on file  Occupational History  . Not on file  Social Needs  . Financial resource strain: Not on file  . Food insecurity:    Worry: Not on file    Inability: Not on file  . Transportation needs:    Medical: Not on file  Non-medical: Not on file  Tobacco Use  . Smoking status: Former Smoker    Packs/day: 0.25    Years: 16.00    Pack years: 4.00    Last attempt to quit: 12/15/2001    Years since quitting: 16.7  . Smokeless tobacco: Never Used  Substance and Sexual Activity  . Alcohol use: Yes    Comment: Rarely  . Drug use: No  . Sexual activity: Yes  Lifestyle  . Physical activity:    Days per week: Not on file    Minutes per session: Not on file  . Stress: Not on file  Relationships  . Social  connections:    Talks on phone: Not on file    Gets together: Not on file    Attends religious service: Not on file    Active member of club or organization: Not on file    Attends meetings of clubs or organizations: Not on file    Relationship status: Not on file  . Intimate partner violence:    Fear of current or ex partner: Not on file    Emotionally abused: Not on file    Physically abused: Not on file    Forced sexual activity: Not on file  Other Topics Concern  . Not on file  Social History Narrative  . Not on file    FAMILY HISTORY: Family History  Problem Relation Age of Onset  . Cancer Mother 104       History of Colon Cancer 2004  . Diabetes Father   . Arthritis Father   . Heart disease Father   . Cancer Maternal Uncle   . Cancer Maternal Grandmother   . Cancer Maternal Uncle   . Cancer Cousin   . Cancer Other        grant grandmother had breast cancer     ALLERGIES:  has No Known Allergies.  MEDICATIONS:  Current Outpatient Medications  Medication Sig Dispense Refill  . calcium carbonate (OSCAL) 1500 (600 Ca) MG TABS tablet Take 1,200 mg by mouth 2 (two) times daily with a meal.    . cholecalciferol (VITAMIN D3) 25 MCG (1000 UT) tablet Take 2,000 Units by mouth daily.    Marland Kitchen esomeprazole (NEXIUM) 40 MG capsule     . ibandronate (BONIVA) 150 MG tablet Take 150 mg by mouth every 30 (thirty) days.    Marland Kitchen ibuprofen (ADVIL,MOTRIN) 200 MG tablet Take 200 mg by mouth every 6 (six) hours as needed. One to two tabs every 8 hours prn pain    . lisinopril-hydrochlorothiazide (PRINZIDE,ZESTORETIC) 10-12.5 MG tablet Take 1 tablet by mouth daily.    . Multiple Vitamins-Minerals (MULTIVITAMIN WITH MINERALS) tablet Take 1 tablet by mouth daily.    . Omega-3 Fatty Acids (FISH OIL) 1200 MG CAPS Take 1 capsule by mouth daily.    . pravastatin (PRAVACHOL) 10 MG tablet Take 10 mg by mouth 2 (two) times a week.    . venlafaxine XR (EFFEXOR-XR) 150 MG 24 hr capsule Take 150 mg by mouth  daily.    . Verapamil HCl CR 200 MG CP24 Take 200 mg by mouth daily.     No current facility-administered medications for this visit.     PHYSICAL EXAMINATION: ECOG PERFORMANCE STATUS: 0 - Asymptomatic  Vitals:   09/27/18 1407  BP: 121/90  Pulse: 90  Resp: 20  Temp: 98 F (36.7 C)  SpO2: 96%   Filed Weights   09/27/18 1407  Weight: 184 lb (83.5 kg)  GENERAL:alert, no distress and comfortable SKIN: skin color, texture, turgor are normal, no rashes or significant lesions EYES: normal, conjunctiva are pink and non-injected, sclera clear OROPHARYNX:no exudate, no erythema and lips, buccal mucosa, and tongue normal  NECK: supple, thyroid normal size, non-tender, without nodularity LYMPH:  no palpable lymphadenopathy in the cervical, axillary or inguinal LUNGS: clear to auscultation and percussion with normal breathing effort HEART: regular rate & rhythm and no murmurs and no lower extremity edema ABDOMEN:abdomen soft, non-tender and normal bowel sounds Musculoskeletal:no cyanosis of digits and no clubbing  PSYCH: alert & oriented x 3 with fluent speech NEURO: no focal motor/sensory deficits  LABORATORY DATA:  I have reviewed the data as listed CBC Latest Ref Rng & Units 04/16/2012 03/14/2012  WBC 4.0 - 10.5 K/uL 5.5 9.1  Hemoglobin 12.0 - 15.0 g/dL 14.1 14.2  Hematocrit 36.0 - 46.0 % 41.0 41.4  Platelets 150 - 400 K/uL 220 219    CMP Latest Ref Rng & Units 04/16/2012 03/14/2012  Glucose 70 - 99 mg/dL 89 103(H)  BUN 6 - 23 mg/dL 13 7  Creatinine 0.50 - 1.10 mg/dL 0.62 0.73  Sodium 135 - 145 mEq/L 138 137  Potassium 3.5 - 5.1 mEq/L 4.4 4.9  Chloride 96 - 112 mEq/L 100 97  CO2 19 - 32 mEq/L 29 32  Calcium 8.4 - 10.5 mg/dL 9.5 10.2  Total Protein 6.0 - 8.3 g/dL 7.4 7.3  Total Bilirubin 0.3 - 1.2 mg/dL 0.3 0.6  Alkaline Phos 39 - 117 U/L 52 57  AST 0 - 37 U/L 20 16  ALT 0 - 35 U/L 11 12     RADIOGRAPHIC STUDIES: I have personally reviewed the radiological images as  listed and agreed with the findings in the report. No results found.  ASSESSMENT & PLAN:  Pamela Luna is a 58 y.o. Caucasian female with a history of GERD, high cholesterol, heart murmur and HTN.    1. 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  -We discussed the Lynch Syndrome is a cancer syndrome cansed by various genetic mutations including PMS2. Comparing to other gene mutations, PMS2 mutation carriers have less risk of colon and endometrial cancer.  -patients with PMS 2 mutations carriers have 15-20% lifetime risk of colon cancer by age of 67,  With the media age of onset in 52s, and 15% lifetime risk of endometrial cancer by age of 45, with media age of onset 59 years old. -PMS2 mutation carrier also has slightly increased risk of extra chronic malignancy, such as stomach, ovary, hepatobiliary track, urinary tract, small bowel, brain, and the combined risk for these malignancies is 6% to age 13. Routine screening for these malignancy are not established and not recommended, especially the patient does not have family history of these cancers.  -I strongly recommend her to start screening colonoscopy, every one to 2 years and upper endoscopy every 3-5 years (due to family history of gastric cancer), to detected earlier stage colon and gastric cancer. We also discussed various other screening tools such as CT colonography and stool test. She is interested in colonoscopy with Dr. Lyndel Safe.  -I also discussed the benefit of aspirin to reduce risk of colon cancer. This has been showed in multiple clinical trials, but has not been the standard recommendation in the high-risk population like her. Side effects of aspirin were discussed with patient.  Patient takes Advil every night for restless  leg syndrome, and she will replaced with aspirin 325 mg daily.  -She has had a left oophorectomy in the 2013 due  to left ovarian cyst. She plans to proceed with Hysterectomy and right oophorectomy on 12/06/18. This has been scheduled with Dr. Radene Knee and I agree with it.  -I also strongly recommend her to continue screening mammogram, given her family history of breast cancer. She agrees. -I discussed healthy lifestyle with heathy diet, exercise and healthy immune system and positive mindset to help reduce risk of cancer. I recommend Vitamin D 1000-2000units daily.  -I strongly encouraged her to notify her son and siblings of her positive mutation and encourage them to get tested and start cancer screenings if applicable.  -She is fine to follow these screening guidelines and follow up with her PCP and gyn. I will see her as needed in the future.    2. Thyroid nodules  -She has 4 thyroid nodules and plans to have biopsy soon    3. GERD, HTN, High Cholesterol  -On Prilosec, lisinopril and Pravastatin -Managed by her PCP    PLAN:  -Copy note to Dr. Lyndel Safe, she will start screening colonoscopy every 1-2 years and EGD every 3-5 years due to Lynch syndrome   -F/u as needed in the future    No orders of the defined types were placed in this encounter.   All questions were answered. The patient knows to call the clinic with any problems, questions or concerns. I spent 30 minutes counseling the patient face to face. The total time spent in the appointment was 45 minutes and more than 50% was on counseling.     Truitt Merle, MD 09/27/2018 3:05 PM  I, Joslyn Devon, am acting as scribe for Truitt Merle, MD.   I have reviewed the above documentation for accuracy and completeness, and I agree with the above.

## 2018-09-27 ENCOUNTER — Inpatient Hospital Stay: Payer: BC Managed Care – PPO | Attending: Hematology | Admitting: Hematology

## 2018-09-27 ENCOUNTER — Ambulatory Visit
Admission: RE | Admit: 2018-09-27 | Discharge: 2018-09-27 | Disposition: A | Discharge status: Home / Self Care | Payer: BC Managed Care – PPO | Source: Ambulatory Visit | Attending: Surgery | Admitting: Surgery

## 2018-09-27 ENCOUNTER — Other Ambulatory Visit: Payer: Self-pay

## 2018-09-27 ENCOUNTER — Encounter: Payer: Self-pay | Admitting: Hematology

## 2018-09-27 DIAGNOSIS — Z809 Family history of malignant neoplasm, unspecified: Secondary | ICD-10-CM | POA: Diagnosis not present

## 2018-09-27 DIAGNOSIS — E7800 Pure hypercholesterolemia, unspecified: Secondary | ICD-10-CM | POA: Diagnosis not present

## 2018-09-27 DIAGNOSIS — K219 Gastro-esophageal reflux disease without esophagitis: Secondary | ICD-10-CM | POA: Diagnosis not present

## 2018-09-27 DIAGNOSIS — I10 Essential (primary) hypertension: Secondary | ICD-10-CM | POA: Diagnosis not present

## 2018-09-27 DIAGNOSIS — Z803 Family history of malignant neoplasm of breast: Secondary | ICD-10-CM | POA: Insufficient documentation

## 2018-09-27 DIAGNOSIS — Z1509 Genetic susceptibility to other malignant neoplasm: Secondary | ICD-10-CM | POA: Insufficient documentation

## 2018-09-27 DIAGNOSIS — Z87891 Personal history of nicotine dependence: Secondary | ICD-10-CM | POA: Insufficient documentation

## 2018-09-27 DIAGNOSIS — E042 Nontoxic multinodular goiter: Secondary | ICD-10-CM | POA: Diagnosis not present

## 2018-09-27 DIAGNOSIS — Z800 Family history of malignant neoplasm of digestive organs: Secondary | ICD-10-CM | POA: Insufficient documentation

## 2018-09-27 DIAGNOSIS — E041 Nontoxic single thyroid nodule: Secondary | ICD-10-CM

## 2018-09-27 DIAGNOSIS — Z8052 Family history of malignant neoplasm of bladder: Secondary | ICD-10-CM | POA: Diagnosis not present

## 2018-09-28 ENCOUNTER — Encounter: Payer: Self-pay | Admitting: Hematology

## 2018-09-28 DIAGNOSIS — Z1509 Genetic susceptibility to other malignant neoplasm: Secondary | ICD-10-CM | POA: Insufficient documentation

## 2018-09-29 ENCOUNTER — Telehealth: Payer: Self-pay | Admitting: Hematology

## 2018-09-29 NOTE — Telephone Encounter (Signed)
No los per 3/2. °

## 2018-10-14 NOTE — Progress Notes (Signed)
Thanks Dr Burr Medico for taking care of her.  Sheri, Pl set her in my clinic in 4-6 weeks (once corona epidemic is over) Thanks [employer]

## 2018-12-06 ENCOUNTER — Ambulatory Visit (HOSPITAL_BASED_OUTPATIENT_CLINIC_OR_DEPARTMENT_OTHER): Admit: 2018-12-06 | Payer: BC Managed Care – PPO | Admitting: Obstetrics and Gynecology

## 2018-12-06 ENCOUNTER — Encounter (HOSPITAL_BASED_OUTPATIENT_CLINIC_OR_DEPARTMENT_OTHER): Payer: Self-pay

## 2018-12-06 SURGERY — HYSTERECTOMY, VAGINAL, LAPAROSCOPY-ASSISTED, WITH SALPINGO-OOPHORECTOMY
Anesthesia: General

## 2018-12-25 NOTE — H&P (Signed)
Patient name Pamela Luna, Brodowski DICTATION# [id number] CSN# [id number]  [name], MD 12/25/2018 8:53 AM

## 2018-12-27 NOTE — H&P (Signed)
NAME: Pamela Luna, AMADON MEDICAL RECORD IR:[id number] ACCOUNT [id number] DATE OF BIRTH:[birth year] FACILITY: [facility] LOCATION:  PHYSICIAN:Konnor Jorden Sherran Needs, MD  HISTORY AND PHYSICAL  DATE OF ADMISSION:  01/10/2019  HISTORY OF PRESENT ILLNESS:  A 58 year old gravida 1, para 61 female presented for laparoscopic-assisted vaginal hysterectomy with right salpingo-oophorectomy.    The patient did test positive for the  PSM2 mutation.  This increases the risk both of ovarian and endometrial cancer.  She had the previous left ovary and tube taken out by Dr. Skeet Latch with finding of a serous cystadenoma.  We did do a complete  ultrasound, saline infusion ultrasound, and endometrial biopsy as well as a CA-125.  All results were unremarkable.  She now presents for definitive surgery in view of her genetic mutation.  ALLERGIES:  She has no known drug allergies.  MEDICATIONS:  She is on lisinopril with hydrochlorothiazide for hypertension, verapamil 1 a day, venlafaxine 75 mg a day, Nexium once a day, pravastatin 10 mg twice a week, and otherwise vitamins.  PAST MEDICAL HISTORY:  She has had usual childhood diseases.  She is being managed for hypertension; also being managed for hyperlipidemia.  PAST SURGICAL HISTORY:  She had a C-section in 1998.  She had removal of the left ovary and tube in 2013.  SOCIAL HISTORY:  Reveals no tobacco or alcohol use.  FAMILY HISTORY:  Significant in that maternal grandmother had stomach cancer, mom had a history of colon cancer, and a maternal uncle had a history of colon cancer.  She also had a first cousin with breast cancer.  REVIEW OF SYSTEMS:  Noncontributory.  PHYSICAL EXAMINATION: VITAL SIGNS:  The patient is afebrile with stable vital signs. HEENT:  The patient is normocephalic.  Pupils were equal, round and reactive to light and accommodation.  Extraocular movements are intact.  Sclerae and conjunctivae were clear.  Oropharynx was clear. LUNGS:   Clear. CARDIOVASCULAR:  Regular rate without murmurs or gallops. ABDOMEN:  Benign.  Well-healed low transverse incision. PELVIC:  Normal external genitalia.  Vaginal mucosa is clear.  Cervix unremarkable.  Uterus normal size, shape and contour.  Adnexa free of mass or tenderness. EXTREMITIES:  Trace edema. NEUROLOGIC:  Grossly normal limits.  IMPRESSION: 1.  PMS2 mutation with increased risk of both ovarian and uterine cancer, now for definitive surgery. 2.  Hypertension. 3.  Hyperlipidemia. 4.  Postmenopausal.  PLAN:  The patient to undergo a laparoscopic-assisted vaginal hysterectomy with removal of remaining tube and ovary.  Risks of surgery have been discussed including the risk of infection, the risk of hemorrhage that could require transfusion with the  risk of AIDS and hepatitis, risk of injury to adjacent organs including bladder, bowel, ureters that could require further exploratory surgery.  Risk of deep venous thrombosis and pulmonary embolus.  With genetic mutations, even the uterus and ovaries  are removed.  Still, intraabdominal carcinomatosis can occur.  The patient understands the potential risks, complications and other alternatives.  LN/NUANCE  D:12/25/2018 T:12/25/2018 JOB:[id number]

## 2018-12-31 NOTE — Patient Instructions (Addendum)
Your procedure is scheduled on  Monday 01/10/2019   Your COVID 19 TEST IS SCHEDULED FOR Thursday 01/06/19 AT 11:00 AM. ONCE YOU COMPLETED THE TEST, PLEASE BEGIN THE QUARATINE INSTRUCTIONS AS INDICATED IN YOUR HANDOUT.      Report to [facility]. M.   Call this number if you have problems the morning of surgery  [phone].   OUR ADDRESS IS [place].  WE ARE LOCATED IN [facility].                                     REMEMBER:  DO NOT EAT FOOD AFTER MIDNIGHT .    TAKE THESE MEDICATIONS MORNING OF SURGERY WITH A SIP OF WATER:  None  IF YOU ARE SPENDING THE NIGHT AFTER SURGERY PLEASE BRING ALL YOUR PRESCRIPTION MEDICATIONS IN THEIR ORIGINAL BOTTLES.                                    DO NOT WEAR JEWERLY, MAKE UP, OR NAIL POLISH,  DO NOT WEAR LOTIONS, POWDERS, PERFUMES OR DEODORANT. DO NOT SHAVE FOR 24 HOURS PRIOR TO DAY OF SURGERY. . CONTACTS, GLASSES, OR DENTURES MAY NOT BE WORN TO SURGERY.                                    [facility] IS NOT RESPONSIBLE  FOR ANY BELONGINGS.                                                                    Marland Kitchen                                                                                                    [facility] - Preparing for Surgery Before surgery, you can play an important role.  Because skin is not sterile, your skin needs to be as free of germs as possible.  You can reduce the number of germs on your skin by washing with CHG (chlorahexidine gluconate) soap before surgery.  CHG is an antiseptic cleaner which kills germs and bonds with the skin to continue killing germs even after washing. Please DO NOT use if you have an allergy to CHG or antibacterial soaps.  If your skin becomes reddened/irritated stop using the CHG and inform your nurse when you arrive at Short Stay. Do not shave (including legs and underarms) for at least 48 hours prior to the first CHG shower.  You may shave  your face/neck. Please follow these instructions carefully:  1.  Shower with CHG Soap the night  before surgery and the  morning of Surgery.  2.  If you choose to wash your hair, wash your hair first as usual with your  normal  shampoo.  3.  After you shampoo, rinse your hair and body thoroughly to remove the  shampoo.                           4.  Use CHG as you would any other liquid soap.  You can apply chg directly  to the skin and wash                       Gently with a scrungie or clean washcloth.  5.  Apply the CHG Soap to your body ONLY FROM THE NECK DOWN.   Do not use on face/ open                           Wound or open sores. Avoid contact with eyes, ears mouth and genitals (private parts).                       Wash face,  Genitals (private parts) with your normal soap.             6.  Wash thoroughly, paying special attention to the area where your surgery  will be performed.  7.  Thoroughly rinse your body with warm water from the neck down.  8.  DO NOT shower/wash with your normal soap after using and rinsing off  the CHG Soap.                9.  Pat yourself dry with a clean towel.            10.  Wear clean pajamas.            11.  Place clean sheets on your bed the night of your first shower and do not  sleep with pets. Day of Surgery : Do not apply any lotions/deodorants the morning of surgery.  Please wear clean clothes to the hospital/surgery center.  FAILURE TO FOLLOW THESE INSTRUCTIONS MAY RESULT IN THE CANCELLATION OF YOUR SURGERY PATIENT SIGNATURE_________________________________  NURSE SIGNATURE__________________________________  ________________________________________________________________________  WHAT IS A BLOOD TRANSFUSION? Blood Transfusion Information  A transfusion is the replacement of blood or some of its parts. Blood is made up of multiple cells which provide different functions.  Red blood cells carry oxygen and are used for blood loss  replacement.  White blood cells fight against infection.  Platelets control bleeding.  Plasma helps clot blood.  Other blood products are available for specialized needs, such as hemophilia or other clotting disorders. BEFORE THE TRANSFUSION  Who gives blood for transfusions?   Healthy volunteers who are fully evaluated to make sure their blood is safe. This is blood bank blood. Transfusion therapy is the safest it has ever been in the practice of medicine. Before blood is taken from a donor, a complete history is taken to make sure that person has no history of diseases nor engages in risky social behavior (examples are intravenous drug use or sexual activity with multiple partners). The donor's travel history is screened to minimize risk of transmitting infections, such as malaria. The donated blood is tested for signs of infectious diseases, such as HIV and hepatitis. The blood is then tested to be sure it is compatible with  you in order to minimize the chance of a transfusion reaction. If you or a relative donates blood, this is often done in anticipation of surgery and is not appropriate for emergency situations. It takes many days to process the donated blood. RISKS AND COMPLICATIONS Although transfusion therapy is very safe and saves many lives, the main dangers of transfusion include:   Getting an infectious disease.  Developing a transfusion reaction. This is an allergic reaction to something in the blood you were given. Every precaution is taken to prevent this. The decision to have a blood transfusion has been considered carefully by your caregiver before blood is given. Blood is not given unless the benefits outweigh the risks. AFTER THE TRANSFUSION  Right after receiving a blood transfusion, you will usually feel much better and more energetic. This is especially true if your red blood cells have gotten low (anemic). The transfusion raises the level of the red blood cells which  carry oxygen, and this usually causes an energy increase.  The nurse administering the transfusion will monitor you carefully for complications. HOME CARE INSTRUCTIONS  No special instructions are needed after a transfusion. You may find your energy is better. Speak with your caregiver about any limitations on activity for underlying diseases you may have. SEEK MEDICAL CARE IF:   Your condition is not improving after your transfusion.  You develop redness or irritation at the intravenous (IV) site. SEEK IMMEDIATE MEDICAL CARE IF:  Any of the following symptoms occur over the next 12 hours:  Shaking chills.  You have a temperature by mouth above 102 F (38.9 C), not controlled by medicine.  Chest, back, or muscle pain.  People around you feel you are not acting correctly or are confused.  Shortness of breath or difficulty breathing.  Dizziness and fainting.  You get a rash or develop hives.  You have a decrease in urine output.  Your urine turns a dark color or changes to pink, red, or brown. Any of the following symptoms occur over the next 10 days:  You have a temperature by mouth above 102 F (38.9 C), not controlled by medicine.  Shortness of breath.  Weakness after normal activity.  The white part of the eye turns yellow (jaundice).  You have a decrease in the amount of urine or are urinating less often.  Your urine turns a dark color or changes to pink, red, or brown. Document Released: 07/11/2000 Document Revised: 10/06/2011 Document Reviewed: 02/28/2008 [facility] Patient Information 2014 [place], [place].  _______________________________________________________________________

## 2019-01-03 ENCOUNTER — Encounter (HOSPITAL_COMMUNITY)
Admission: RE | Admit: 2019-01-03 | Discharge: 2019-01-03 | Disposition: A | Discharge status: Home / Self Care | Payer: BC Managed Care – PPO | Source: Ambulatory Visit | Attending: Obstetrics and Gynecology | Admitting: Obstetrics and Gynecology

## 2019-01-03 ENCOUNTER — Encounter (HOSPITAL_COMMUNITY): Payer: Self-pay

## 2019-01-03 ENCOUNTER — Other Ambulatory Visit: Payer: Self-pay

## 2019-01-03 DIAGNOSIS — Z1502 Genetic susceptibility to malignant neoplasm of ovary: Secondary | ICD-10-CM | POA: Insufficient documentation

## 2019-01-03 DIAGNOSIS — Z01818 Encounter for other preprocedural examination: Secondary | ICD-10-CM | POA: Insufficient documentation

## 2019-01-03 DIAGNOSIS — Z1509 Genetic susceptibility to other malignant neoplasm: Secondary | ICD-10-CM | POA: Diagnosis not present

## 2019-01-03 LAB — CBC
HCT: 41.4 % (ref 36.0–46.0)
Hemoglobin: 13.4 g/dL (ref 12.0–15.0)
MCH: 28.9 pg (ref 26.0–34.0)
MCHC: 32.4 g/dL (ref 30.0–36.0)
MCV: 89.2 fL (ref 80.0–100.0)
Platelets: 211 10*3/uL (ref 150–400)
RBC: 4.64 MIL/uL (ref 3.87–5.11)
RDW: 14.3 % (ref 11.5–15.5)
WBC: 5.6 10*3/uL (ref 4.0–10.5)
nRBC: 0 % (ref 0.0–0.2)

## 2019-01-03 LAB — COMPREHENSIVE METABOLIC PANEL
ALT: 17 U/L (ref 0–44)
AST: 21 U/L (ref 15–41)
Albumin: 4 g/dL (ref 3.5–5.0)
Alkaline Phosphatase: 51 U/L (ref 38–126)
Anion gap: 7 (ref 5–15)
BUN: 17 mg/dL (ref 6–20)
CO2: 30 mmol/L (ref 22–32)
Calcium: 9.4 mg/dL (ref 8.9–10.3)
Chloride: 102 mmol/L (ref 98–111)
Creatinine, Ser: 0.65 mg/dL (ref 0.44–1.00)
GFR calc Af Amer: >60 mL/min (ref >60)
GFR calc non Af Amer: >60 mL/min (ref >60)
Glucose, Bld: 113 mg/dL — ABNORMAL HIGH (ref 70–99)
Potassium: 4.4 mmol/L (ref 3.5–5.1)
Sodium: 139 mmol/L (ref 135–145)
Total Bilirubin: 0.1 mg/dL — ABNORMAL LOW (ref 0.3–1.2)
Total Protein: 7.4 g/dL (ref 6.5–8.1)

## 2019-01-06 ENCOUNTER — Other Ambulatory Visit (HOSPITAL_COMMUNITY)
Admission: RE | Admit: 2019-01-06 | Discharge: 2019-01-06 | Disposition: A | Discharge status: Home / Self Care | Payer: BC Managed Care – PPO | Source: Ambulatory Visit | Attending: Obstetrics and Gynecology | Admitting: Obstetrics and Gynecology

## 2019-01-06 ENCOUNTER — Other Ambulatory Visit: Payer: Self-pay

## 2019-01-06 DIAGNOSIS — Z1159 Encounter for screening for other viral diseases: Secondary | ICD-10-CM | POA: Insufficient documentation

## 2019-01-07 LAB — NOVEL CORONAVIRUS, NAA (HOSP ORDER, SEND-OUT TO REF LAB; TAT 18-24 HRS): SARS-CoV-2, NAA: NOT DETECTED

## 2019-01-07 NOTE — Progress Notes (Signed)
SPOKE W/  _pt     SCREENING SYMPTOMS OF COVID 19:   COUGH--n  RUNNY NOSE--n-   SORE THROAT--n  NASAL CONGESTION--n-  SNEEZING--n--  SHORTNESS OF BREATH---n  DIFFICULTY BREATHING---n  TEMP >100.0 -----n  UNEXPLAINED BODY ACHES----n--  CHILLS ------n--   HEADACHES ---n------  LOSS OF SMELL/ TASTE ---n-----    HAVE YOU OR ANY FAMILY MEMBER TRAVELLED PAST 14 DAYS OUT OF THE   COUNTY--n- STATE----n COUNTRY--n--  HAVE YOU OR ANY FAMILY MEMBER BEEN EXPOSED TO ANYONE WITH COVID 19?  n

## 2019-01-07 NOTE — Progress Notes (Signed)
Called patient and left message to return call to complete pre-op COVID-19 screening questions.

## 2019-01-10 ENCOUNTER — Encounter (HOSPITAL_BASED_OUTPATIENT_CLINIC_OR_DEPARTMENT_OTHER)
Admission: RE | Disposition: A | Discharge status: Home / Self Care | Payer: Self-pay | Source: Home / Self Care | Attending: Obstetrics and Gynecology

## 2019-01-10 ENCOUNTER — Observation Stay (HOSPITAL_BASED_OUTPATIENT_CLINIC_OR_DEPARTMENT_OTHER)
Admission: RE | Admit: 2019-01-10 | Discharge: 2019-01-11 | Disposition: A | Discharge status: Home / Self Care | Payer: BC Managed Care – PPO | Attending: Obstetrics and Gynecology | Admitting: Obstetrics and Gynecology

## 2019-01-10 ENCOUNTER — Observation Stay (HOSPITAL_BASED_OUTPATIENT_CLINIC_OR_DEPARTMENT_OTHER): Payer: BC Managed Care – PPO | Admitting: Physician Assistant

## 2019-01-10 ENCOUNTER — Encounter (HOSPITAL_BASED_OUTPATIENT_CLINIC_OR_DEPARTMENT_OTHER): Payer: Self-pay

## 2019-01-10 ENCOUNTER — Observation Stay (HOSPITAL_BASED_OUTPATIENT_CLINIC_OR_DEPARTMENT_OTHER): Payer: BC Managed Care – PPO | Admitting: Anesthesiology

## 2019-01-10 ENCOUNTER — Other Ambulatory Visit: Payer: Self-pay

## 2019-01-10 DIAGNOSIS — E785 Hyperlipidemia, unspecified: Secondary | ICD-10-CM | POA: Insufficient documentation

## 2019-01-10 DIAGNOSIS — N888 Other specified noninflammatory disorders of cervix uteri: Secondary | ICD-10-CM | POA: Diagnosis not present

## 2019-01-10 DIAGNOSIS — Z79899 Other long term (current) drug therapy: Secondary | ICD-10-CM | POA: Diagnosis not present

## 2019-01-10 DIAGNOSIS — Z800 Family history of malignant neoplasm of digestive organs: Secondary | ICD-10-CM | POA: Insufficient documentation

## 2019-01-10 DIAGNOSIS — N72 Inflammatory disease of cervix uteri: Secondary | ICD-10-CM | POA: Diagnosis not present

## 2019-01-10 DIAGNOSIS — N83291 Other ovarian cyst, right side: Secondary | ICD-10-CM | POA: Diagnosis not present

## 2019-01-10 DIAGNOSIS — Z1502 Genetic susceptibility to malignant neoplasm of ovary: Principal | ICD-10-CM | POA: Insufficient documentation

## 2019-01-10 DIAGNOSIS — R102 Pelvic and perineal pain: Secondary | ICD-10-CM | POA: Insufficient documentation

## 2019-01-10 DIAGNOSIS — Z1504 Genetic susceptibility to malignant neoplasm of endometrium: Secondary | ICD-10-CM | POA: Diagnosis not present

## 2019-01-10 DIAGNOSIS — I10 Essential (primary) hypertension: Secondary | ICD-10-CM | POA: Diagnosis not present

## 2019-01-10 DIAGNOSIS — D259 Leiomyoma of uterus, unspecified: Secondary | ICD-10-CM | POA: Diagnosis not present

## 2019-01-10 DIAGNOSIS — Z1509 Genetic susceptibility to other malignant neoplasm: Secondary | ICD-10-CM | POA: Diagnosis present

## 2019-01-10 DIAGNOSIS — Z803 Family history of malignant neoplasm of breast: Secondary | ICD-10-CM | POA: Insufficient documentation

## 2019-01-10 DIAGNOSIS — N838 Other noninflammatory disorders of ovary, fallopian tube and broad ligament: Secondary | ICD-10-CM | POA: Insufficient documentation

## 2019-01-10 DIAGNOSIS — Z90710 Acquired absence of both cervix and uterus: Secondary | ICD-10-CM | POA: Diagnosis present

## 2019-01-10 HISTORY — PX: CYSTOSCOPY: SHX5120

## 2019-01-10 HISTORY — PX: LAPAROSCOPIC VAGINAL HYSTERECTOMY WITH SALPINGO OOPHORECTOMY: SHX6681

## 2019-01-10 LAB — TYPE AND SCREEN
ABO/RH(D): O NEG
Antibody Screen: NEGATIVE

## 2019-01-10 SURGERY — HYSTERECTOMY, VAGINAL, LAPAROSCOPY-ASSISTED, WITH SALPINGO-OOPHORECTOMY
Anesthesia: General

## 2019-01-10 MED ORDER — FENTANYL CITRATE (PF) 100 MCG/2ML IJ SOLN
INTRAMUSCULAR | Status: AC
  Filled 2019-01-10: qty 2

## 2019-01-10 MED ORDER — MIDAZOLAM HCL 2 MG/2ML IJ SOLN
INTRAMUSCULAR | Status: AC
  Filled 2019-01-10: qty 2

## 2019-01-10 MED ORDER — VENLAFAXINE HCL ER 150 MG PO CP24
150.0000 mg | ORAL_CAPSULE | Freq: Every day | ORAL | Status: DC
  Administered 2019-01-10: 150 mg via ORAL
  Filled 2019-01-10: qty 1

## 2019-01-10 MED ORDER — OXYCODONE-ACETAMINOPHEN 5-325 MG PO TABS
ORAL_TABLET | ORAL | Status: AC
  Filled 2019-01-10: qty 1

## 2019-01-10 MED ORDER — DEXAMETHASONE SODIUM PHOSPHATE 4 MG/ML IJ SOLN
INTRAMUSCULAR | Status: DC | PRN
  Administered 2019-01-10: 10 mg via INTRAVENOUS

## 2019-01-10 MED ORDER — OXYCODONE-ACETAMINOPHEN 5-325 MG PO TABS
1.0000 | ORAL_TABLET | ORAL | Status: DC | PRN
  Administered 2019-01-10 – 2019-01-11 (×2): 1 via ORAL
  Filled 2019-01-10: qty 2

## 2019-01-10 MED ORDER — PROPOFOL 10 MG/ML IV BOLUS
INTRAVENOUS | Status: AC
  Filled 2019-01-10: qty 40

## 2019-01-10 MED ORDER — BUPIVACAINE HCL (PF) 0.25 % IJ SOLN
INTRAMUSCULAR | Status: DC | PRN
  Administered 2019-01-10: 7 mL

## 2019-01-10 MED ORDER — HYDROCHLOROTHIAZIDE 12.5 MG PO CAPS
12.5000 mg | ORAL_CAPSULE | Freq: Every day | ORAL | Status: DC
  Administered 2019-01-10: 12.5 mg via ORAL
  Filled 2019-01-10: qty 1

## 2019-01-10 MED ORDER — DEXAMETHASONE SODIUM PHOSPHATE 10 MG/ML IJ SOLN
INTRAMUSCULAR | Status: AC
  Filled 2019-01-10: qty 1

## 2019-01-10 MED ORDER — DIPHENHYDRAMINE HCL 50 MG/ML IJ SOLN
INTRAMUSCULAR | Status: AC
  Filled 2019-01-10: qty 1

## 2019-01-10 MED ORDER — NALOXONE HCL 0.4 MG/ML IJ SOLN
0.4000 mg | INTRAMUSCULAR | Status: DC | PRN
  Filled 2019-01-10: qty 1

## 2019-01-10 MED ORDER — SUGAMMADEX SODIUM 200 MG/2ML IV SOLN
INTRAVENOUS | Status: AC
  Filled 2019-01-10: qty 2

## 2019-01-10 MED ORDER — BUPIVACAINE HCL (PF) 0.25 % IJ SOLN
INTRAMUSCULAR | Status: AC
  Filled 2019-01-10: qty 30

## 2019-01-10 MED ORDER — DIPHENHYDRAMINE HCL 12.5 MG/5ML PO ELIX
12.5000 mg | ORAL_SOLUTION | Freq: Four times a day (QID) | ORAL | Status: DC | PRN
  Filled 2019-01-10: qty 5

## 2019-01-10 MED ORDER — ROCURONIUM BROMIDE 100 MG/10ML IV SOLN
INTRAVENOUS | Status: DC | PRN
  Administered 2019-01-10: 60 mg via INTRAVENOUS
  Administered 2019-01-10: 20 mg via INTRAVENOUS
  Administered 2019-01-10: 10 mg via INTRAVENOUS

## 2019-01-10 MED ORDER — LISINOPRIL 10 MG PO TABS
10.0000 mg | ORAL_TABLET | Freq: Every day | ORAL | Status: DC
  Administered 2019-01-10: 10 mg via ORAL
  Filled 2019-01-10: qty 1

## 2019-01-10 MED ORDER — MENTHOL 3 MG MT LOZG
1.0000 | LOZENGE | OROMUCOSAL | Status: DC | PRN
  Filled 2019-01-10: qty 9

## 2019-01-10 MED ORDER — ARTIFICIAL TEARS OPHTHALMIC OINT
TOPICAL_OINTMENT | OPHTHALMIC | Status: AC
  Filled 2019-01-10: qty 3.5

## 2019-01-10 MED ORDER — SUGAMMADEX SODIUM 200 MG/2ML IV SOLN
INTRAVENOUS | Status: DC | PRN
  Administered 2019-01-10: 200 mg via INTRAVENOUS

## 2019-01-10 MED ORDER — SCOPOLAMINE 1 MG/3DAYS TD PT72
MEDICATED_PATCH | TRANSDERMAL | Status: DC | PRN
  Administered 2019-01-10: 1 via TRANSDERMAL

## 2019-01-10 MED ORDER — SODIUM CHLORIDE 0.9% FLUSH
9.0000 mL | INTRAVENOUS | Status: DC | PRN
  Filled 2019-01-10: qty 10

## 2019-01-10 MED ORDER — VERAPAMIL HCL ER 180 MG PO TBCR
200.0000 mg | EXTENDED_RELEASE_TABLET | Freq: Every day | ORAL | Status: DC
  Filled 2019-01-10: qty 0.5

## 2019-01-10 MED ORDER — ONDANSETRON HCL 4 MG PO TABS
4.0000 mg | ORAL_TABLET | Freq: Four times a day (QID) | ORAL | Status: DC | PRN
  Filled 2019-01-10: qty 1

## 2019-01-10 MED ORDER — ONDANSETRON HCL 4 MG/2ML IJ SOLN
4.0000 mg | Freq: Four times a day (QID) | INTRAMUSCULAR | Status: DC | PRN
  Filled 2019-01-10: qty 2

## 2019-01-10 MED ORDER — VERAPAMIL HCL ER 180 MG PO TBCR
200.0000 mg | EXTENDED_RELEASE_TABLET | Freq: Every day | ORAL | Status: DC
  Administered 2019-01-10: 210 mg via ORAL
  Filled 2019-01-10: qty 0.5

## 2019-01-10 MED ORDER — MIDAZOLAM HCL 5 MG/5ML IJ SOLN
INTRAMUSCULAR | Status: DC | PRN
  Administered 2019-01-10: 2 mg via INTRAVENOUS

## 2019-01-10 MED ORDER — FENTANYL CITRATE (PF) 100 MCG/2ML IJ SOLN
INTRAMUSCULAR | Status: DC | PRN
  Administered 2019-01-10 (×2): 50 ug via INTRAVENOUS
  Administered 2019-01-10: 25 ug via INTRAVENOUS
  Administered 2019-01-10 (×2): 50 ug via INTRAVENOUS
  Administered 2019-01-10 (×3): 25 ug via INTRAVENOUS

## 2019-01-10 MED ORDER — SODIUM CHLORIDE 0.9 % IR SOLN
Status: DC | PRN
  Administered 2019-01-10: 1000 mL

## 2019-01-10 MED ORDER — ROCURONIUM BROMIDE 10 MG/ML (PF) SYRINGE
PREFILLED_SYRINGE | INTRAVENOUS | Status: AC
  Filled 2019-01-10: qty 10

## 2019-01-10 MED ORDER — LACTATED RINGERS IV SOLN
INTRAVENOUS | Status: DC
  Administered 2019-01-10 (×2): via INTRAVENOUS
  Administered 2019-01-10: 75 mL/h via INTRAVENOUS
  Filled 2019-01-10: qty 1000

## 2019-01-10 MED ORDER — OXYCODONE HCL 5 MG PO TABS
5.0000 mg | ORAL_TABLET | Freq: Once | ORAL | Status: DC | PRN
  Filled 2019-01-10: qty 1

## 2019-01-10 MED ORDER — LIDOCAINE HCL (CARDIAC) PF 100 MG/5ML IV SOSY
PREFILLED_SYRINGE | INTRAVENOUS | Status: DC | PRN
  Administered 2019-01-10: 60 mg via INTRAVENOUS

## 2019-01-10 MED ORDER — KETOROLAC TROMETHAMINE 30 MG/ML IJ SOLN
INTRAMUSCULAR | Status: DC | PRN
  Administered 2019-01-10: 30 mg via INTRAVENOUS

## 2019-01-10 MED ORDER — PROPOFOL 10 MG/ML IV BOLUS
INTRAVENOUS | Status: DC | PRN
  Administered 2019-01-10: 100 mg via INTRAVENOUS
  Administered 2019-01-10: 50 mg via INTRAVENOUS
  Administered 2019-01-10: 160 mg via INTRAVENOUS
  Administered 2019-01-10: 40 mg via INTRAVENOUS

## 2019-01-10 MED ORDER — STERILE WATER FOR IRRIGATION IR SOLN
Status: DC | PRN
  Administered 2019-01-10: 1000 mL via INTRAVESICAL

## 2019-01-10 MED ORDER — FENTANYL CITRATE (PF) 100 MCG/2ML IJ SOLN
25.0000 ug | INTRAMUSCULAR | Status: DC | PRN
  Administered 2019-01-10 (×2): 25 ug via INTRAVENOUS
  Administered 2019-01-10: 50 ug via INTRAVENOUS
  Filled 2019-01-10: qty 1

## 2019-01-10 MED ORDER — ONDANSETRON HCL 4 MG/2ML IJ SOLN
INTRAMUSCULAR | Status: AC
  Filled 2019-01-10: qty 2

## 2019-01-10 MED ORDER — SCOPOLAMINE 1 MG/3DAYS TD PT72
MEDICATED_PATCH | TRANSDERMAL | Status: AC
  Filled 2019-01-10: qty 1

## 2019-01-10 MED ORDER — ONDANSETRON HCL 4 MG/2ML IJ SOLN
INTRAMUSCULAR | Status: DC | PRN
  Administered 2019-01-10: 4 mg via INTRAVENOUS

## 2019-01-10 MED ORDER — DIPHENHYDRAMINE HCL 50 MG/ML IJ SOLN
12.5000 mg | Freq: Four times a day (QID) | INTRAMUSCULAR | Status: DC | PRN
  Administered 2019-01-10: 12.5 mg via INTRAVENOUS
  Filled 2019-01-10: qty 0.25

## 2019-01-10 MED ORDER — LIDOCAINE 2% (20 MG/ML) 5 ML SYRINGE
INTRAMUSCULAR | Status: AC
  Filled 2019-01-10: qty 5

## 2019-01-10 MED ORDER — CEFAZOLIN SODIUM-DEXTROSE 2-4 GM/100ML-% IV SOLN
2.0000 g | INTRAVENOUS | Status: AC
  Administered 2019-01-10: 2 g via INTRAVENOUS
  Filled 2019-01-10: qty 100

## 2019-01-10 MED ORDER — OXYCODONE HCL 5 MG/5ML PO SOLN
5.0000 mg | Freq: Once | ORAL | Status: DC | PRN
  Filled 2019-01-10: qty 5

## 2019-01-10 MED ORDER — HYDROMORPHONE 1 MG/ML IV SOLN
INTRAVENOUS | Status: DC
  Administered 2019-01-10: 1 mg via INTRAVENOUS
  Administered 2019-01-10: 3.2 mg via INTRAVENOUS
  Administered 2019-01-10: 1 mg via INTRAVENOUS
  Administered 2019-01-10: 30 mg via INTRAVENOUS
  Administered 2019-01-10: 2.2 mg via INTRAVENOUS
  Filled 2019-01-10 (×2): qty 30

## 2019-01-10 MED ORDER — LACTATED RINGERS IV SOLN
INTRAVENOUS | Status: DC
  Administered 2019-01-10: 18:00:00 via INTRAVENOUS
  Filled 2019-01-10 (×2): qty 1000

## 2019-01-10 MED ORDER — CEFAZOLIN SODIUM-DEXTROSE 2-4 GM/100ML-% IV SOLN
INTRAVENOUS | Status: AC
  Filled 2019-01-10: qty 100

## 2019-01-10 SURGICAL SUPPLY — 66 items
ADH SKN CLS APL DERMABOND .7 (GAUZE/BANDAGES/DRESSINGS) ×1
BAG RETRIEVAL 10 (BASKET)
BAG RETRIEVAL 10MM (BASKET)
BLADE CLIPPER SENSICLIP SURGIC (BLADE) IMPLANT
CANISTER SUCT 3000ML PPV (MISCELLANEOUS) ×6 IMPLANT
CATH ROBINSON RED A/P 16FR (CATHETERS) ×3 IMPLANT
CLOSURE WOUND 1/4X4 (GAUZE/BANDAGES/DRESSINGS)
COVER BACK TABLE 60X90IN (DRAPES) ×3 IMPLANT
COVER MAYO STAND STRL (DRAPES) ×6 IMPLANT
COVER SURGICAL LIGHT HANDLE (MISCELLANEOUS) IMPLANT
COVER WAND RF STERILE (DRAPES) ×3 IMPLANT
DERMABOND ADVANCED (GAUZE/BANDAGES/DRESSINGS) ×2
DERMABOND ADVANCED .7 DNX12 (GAUZE/BANDAGES/DRESSINGS) ×1 IMPLANT
DRSG COVADERM PLUS 2X2 (GAUZE/BANDAGES/DRESSINGS) IMPLANT
DRSG OPSITE POSTOP 3X4 (GAUZE/BANDAGES/DRESSINGS) ×3 IMPLANT
DURAPREP 26ML APPLICATOR (WOUND CARE) ×3 IMPLANT
ELECT REM PT RETURN 9FT ADLT (ELECTROSURGICAL) ×3
ELECTRODE REM PT RTRN 9FT ADLT (ELECTROSURGICAL) ×1 IMPLANT
GAUZE 4X4 16PLY RFD (DISPOSABLE) ×3 IMPLANT
GLOVE BIO SURGEON STRL SZ7 (GLOVE) ×12 IMPLANT
GLOVE ECLIPSE 6.5 STRL STRAW (GLOVE) ×3 IMPLANT
GOWN STRL REUS W/ TWL XL LVL3 (GOWN DISPOSABLE) ×1 IMPLANT
GOWN STRL REUS W/TWL XL LVL3 (GOWN DISPOSABLE) ×3
HOLDER FOLEY CATH W/STRAP (MISCELLANEOUS) ×3 IMPLANT
KIT TURNOVER CYSTO (KITS) ×3 IMPLANT
NEEDLE INSUFFLATION 120MM (ENDOMECHANICALS) IMPLANT
NS IRRIG 500ML POUR BTL (IV SOLUTION) ×3 IMPLANT
PACK LAVH (CUSTOM PROCEDURE TRAY) ×3 IMPLANT
PACK ROBOTIC GOWN (GOWN DISPOSABLE) ×2 IMPLANT
PACK TRENDGUARD 450 HYBRID PRO (MISCELLANEOUS) IMPLANT
PAD OB MATERNITY 4.3X12.25 (PERSONAL CARE ITEMS) ×3 IMPLANT
PAD PREP 24X48 CUFFED NSTRL (MISCELLANEOUS) ×3 IMPLANT
SCISSORS LAP 5X45 EPIX DISP (ENDOMECHANICALS) IMPLANT
SEALER TISSUE G2 CVD JAW 45CM (ENDOMECHANICALS) ×3 IMPLANT
SET CYSTO W/LG BORE CLAMP LF (SET/KITS/TRAYS/PACK) ×2 IMPLANT
SET IRRIG TUBING LAPAROSCOPIC (IRRIGATION / IRRIGATOR) ×3 IMPLANT
SET IRRIG Y TYPE TUR BLADDER L (SET/KITS/TRAYS/PACK) ×3 IMPLANT
SOLUTION ELECTROLUBE (MISCELLANEOUS) IMPLANT
STRIP CLOSURE SKIN 1/4X4 (GAUZE/BANDAGES/DRESSINGS) IMPLANT
SUT VIC AB 0 CT1 18XCR BRD8 (SUTURE) ×2 IMPLANT
SUT VIC AB 0 CT1 36 (SUTURE) ×6 IMPLANT
SUT VIC AB 0 CT1 8-18 (SUTURE) ×6
SUT VIC AB 2-0 CT1 (SUTURE) IMPLANT
SUT VIC AB 2-0 SH 27 (SUTURE)
SUT VIC AB 2-0 SH 27XBRD (SUTURE) IMPLANT
SUT VIC AB 3-0 SH 27 (SUTURE)
SUT VIC AB 3-0 SH 27X BRD (SUTURE) IMPLANT
SUT VICRYL 0 UR6 27IN ABS (SUTURE) IMPLANT
SUT VICRYL 1 TIES 12X18 (SUTURE) ×3 IMPLANT
SUT VICRYL 4-0 PS2 18IN ABS (SUTURE) ×3 IMPLANT
SYR BULB IRRIGATION 50ML (SYRINGE) IMPLANT
SYS BAG RETRIEVAL 10MM (BASKET)
SYSTEM BAG RETRIEVAL 10MM (BASKET) IMPLANT
TOWEL OR 17X26 10 PK STRL BLUE (TOWEL DISPOSABLE) ×6 IMPLANT
TRAY FOLEY W/BAG SLVR 14FR (SET/KITS/TRAYS/PACK) ×3 IMPLANT
TRENDGUARD 450 HYBRID PRO PACK (MISCELLANEOUS) ×3
TROCAR BALLN 12MMX100 BLUNT (TROCAR) IMPLANT
TROCAR BLADELESS OPT 12M 100M (ENDOMECHANICALS) IMPLANT
TROCAR OPTI TIP 5M 100M (ENDOMECHANICALS) ×3 IMPLANT
TROCAR XCEL DIL TIP R 11M (ENDOMECHANICALS) IMPLANT
TUBE CONNECTING 12'X1/4 (SUCTIONS)
TUBE CONNECTING 12X1/4 (SUCTIONS) IMPLANT
TUBING EVAC SMOKE HEATED PNEUM (TUBING) ×3 IMPLANT
WARMER LAPAROSCOPE (MISCELLANEOUS) ×3 IMPLANT
WATER STERILE IRR 3000ML UROMA (IV SOLUTION) ×3 IMPLANT
WATER STERILE IRR 500ML POUR (IV SOLUTION) ×3 IMPLANT

## 2019-01-10 NOTE — Progress Notes (Signed)
Dilaudid PCA Waste: 19 mL wasted of dilaudid PCA into stericycle witnessed by Dory Larsen, RN. Patient tolerating oral intake well and has been bridged over to oral pain medication. Patient currently rating pain 2/10 and is resting comfortably. PRN dose of 12.5mg  IV benadryl administered for itching potentially in relation to dilaudid PCA. Patient has no other requests at this time. Will continue to monitor.

## 2019-01-10 NOTE — Transfer of Care (Signed)
Immediate Anesthesia Transfer of Care Note  Patient: Pamela Luna  Procedure(s) Performed: Procedure(s) (LRB): LAPAROSCOPIC ASSISTED VAGINAL HYSTERECTOMY WITH RIGHT SALPINGO OOPHORECTOMY (N/A) CYSTOSCOPY (N/A)  Patient Location: PACU  Anesthesia Type: General  Level of Consciousness: awake, sedated, patient cooperative and responds to stimulation  Airway & Oxygen Therapy: Patient Spontanous Breathing and Patient connected to [place] O2 and soft FM  Post-op Assessment: Report given to PACU RN, Post -op Vital signs reviewed and stable and Patient moving all extremities  Post vital signs: Reviewed and stable  Complications: No apparent anesthesia complications

## 2019-01-10 NOTE — Progress Notes (Signed)
Patient ID: Pamela Luna, female   DOB: 1960-11-15, 58 y.o.   MRN: [id number] Doing well post op Af vss abd soft Inc clear Good uo

## 2019-01-10 NOTE — Progress Notes (Signed)
01/10/2019 8:17 PM Verbal order Dr. Radene Knee ok for patient to take home medications as scheduled. Nexium 20 mg QD, Effexor XR 150 mg QD, Lisinopril-HCTZ 10-12.5mg  QD, Verapamil 200 mg QD. Orders placed and enacted. Will continue to closely monitor patient.  Chlora Mcbain, Arville Lime

## 2019-01-10 NOTE — Anesthesia Procedure Notes (Signed)
Procedure Name: Intubation Date/Time: 01/10/2019 7:28 AM Performed by: Justice Rocher, CRNA Pre-anesthesia Checklist: Patient identified, Emergency Drugs available, Suction available and Patient being monitored Patient Re-evaluated:Patient Re-evaluated prior to induction Oxygen Delivery Method: Circle system utilized Preoxygenation: Pre-oxygenation with 100% oxygen Induction Type: IV induction Ventilation: Mask ventilation without difficulty Laryngoscope Size: Mac and 4 Grade View: Grade II Tube type: Oral Tube size: 7.5 mm Number of attempts: 1 Airway Equipment and Method: Stylet and Oral airway Placement Confirmation: ETT inserted through vocal cords under direct vision,  positive ETCO2 and breath sounds checked- equal and bilateral Secured at: 23 cm Tube secured with: Tape Dental Injury: Teeth and Oropharynx as per pre-operative assessment

## 2019-01-10 NOTE — Brief Op Note (Signed)
01/10/2019  9:07 AM  PATIENT:  Pamela Luna  58 y.o. female  PRE-OPERATIVE DIAGNOSIS:  PELVIC PAIN, PMST MUTATION  POST-OPERATIVE DIAGNOSIS:  PELVIC PAIN, PMST MUTATION  PROCEDURE:  Procedure(s): LAPAROSCOPIC ASSISTED VAGINAL HYSTERECTOMY WITH RIGHT SALPINGO OOPHORECTOMY (N/A) CYSTOSCOPY (N/A)  SURGEON:  Surgeon(s) and Role:     * [name], MD - Primary    * Molli Posey, MD - Assisting  PHYSICIAN ASSISTANT:   ASSISTANTS: HOLLAND    ANESTHESIA.  geT2   EBL:  200 mL   BLOOD ADMINISTERED:none  DRAINS: Urinary Catheter (Foley)   LOCAL MEDICATIONS USED:  XYLOCAINE   SPECIMEN:  Source of Specimen:  UTERUS RIGHT TUBE AND OVARY  DISPOSITION OF SPECIMEN:  PATHOLOGY  COUNTS:  YES  TOURNIQUET:  * No tourniquets in log *  DICTATION: .Other Dictation: Dictation Number [id number]  PLAN OF CARE: Admit for overnight observation  PATIENT DISPOSITION:  PACU - hemodynamically stable.   Delay start of Pharmacological VTE agent (>24hrs) due to surgical blood loss or risk of bleeding: no

## 2019-01-10 NOTE — H&P (Signed)
  History and physical exam unchanged 

## 2019-01-10 NOTE — Anesthesia Preprocedure Evaluation (Signed)
Anesthesia Evaluation  Patient identified by MRN, date of birth, ID band Patient awake    Reviewed: Allergy & Precautions, H&P , NPO status , Patient's Chart, lab work & pertinent test results  Airway Mallampati: II   Neck ROM: full    Dental   Pulmonary former smoker,    breath sounds clear to auscultation       Cardiovascular hypertension,  Rhythm:regular Rate:Normal     Neuro/Psych    GI/Hepatic GERD  ,  Endo/Other    Renal/GU      Musculoskeletal   Abdominal   Peds  Hematology   Anesthesia Other Findings   Reproductive/Obstetrics                             Anesthesia Physical Anesthesia Plan  ASA: II  Anesthesia Plan: General   Post-op Pain Management:    Induction: Intravenous  PONV Risk Score and Plan: 3 and Ondansetron, Dexamethasone, Midazolam and Treatment may vary due to age or medical condition  Airway Management Planned: Oral ETT  Additional Equipment:   Intra-op Plan:   Post-operative Plan: Extubation in OR  Informed Consent: I have reviewed the patients History and Physical, chart, labs and discussed the procedure including the risks, benefits and alternatives for the proposed anesthesia with the patient or authorized representative who has indicated his/her understanding and acceptance.       Plan Discussed with: CRNA, Anesthesiologist and Surgeon  Anesthesia Plan Comments:         Anesthesia Quick Evaluation

## 2019-01-10 NOTE — Op Note (Signed)
NAME: Pamela Luna, Pamela Luna MEDICAL RECORD [id number] ACCOUNT [id number] DATE OF BIRTH:[birth year] FACILITY: [facility] LOCATION: [facility] PHYSICIAN:Dorthula Bier Sherran Needs, MD  OPERATIVE REPORT  DATE OF PROCEDURE:  01/10/2019  PREOPERATIVE DIAGNOSIS:  The patient positive for the PSM2 mutation with increased risk of both ovarian and endometrial cancer.  POSTOPERATIVE DIAGNOSIS:  The patient positive for the PSM2 mutation with increased risk of both ovarian and endometrial cancer.  OPERATIVE PROCEDURES:  Laparoscopic assisted vaginal hysterectomy with right salpingo-oophorectomy and cystoscopy.  SURGEON:  Darlyn Chamber, MD  ASSISTANT:  Molli Posey, DO  ANESTHESIA:  General endotracheal.  ESTIMATED BLOOD LOSS:  200  mL  PACKS AND DRAINS:  None.  INTRAOPERATIVE BLOOD PLACED:  None.  COMPLICATIONS:  None.  INDICATIONS:  As dictated in history and physical.  DESCRIPTION OF PROCEDURE:  The patient was taken to the OR and placed in supine position.  After satisfactory level of general endotracheal anesthesia was obtained, the patient was placed in the dorsal lithotomy position using the Allen stirrups.  The  abdomen was prepped out with DuraPrep.  The vagina and perineum were prepped out with an antiseptic solution.  Bladder was entered with catheterization.  The Hulka tenaculum was put in place and secured.  The patient was then draped in sterile field.   Subumbilical incision made with a knife and extended through the subcutaneous tissue.  Fascia was identified, entered sharply.  Incision extended through the skin laterally.  Peritoneum was entered with blunt finger pressure.  The open laparoscopic  trocar was put in place and secured.  Abdomen was inflated with carbon dioxide.  Laparoscope was introduced.  Visualization revealed no evidence of injury to adjacent organs.  A 5 mm trocar was put in place under direct visualization.  On visualization,  the left tube and ovary, surgically absent.   Right tube and ovary were unremarkable.  She had a dense adhesion from the uterus to the anterior abdominal wall from her prior cesarean sections.  Cul-de-sac was clear.  Upper abdomen, including liver tip and  the gallbladder, were clear.  Lateral gutters were cleared including the appendix.  Next, we brought in the EnSeal.  The right ovarian vasculature was cauterized and incised.  Mesenteric attachments of the ovary and tube were cauterized and incised up  to the round ligament.  The round ligament was then cauterized and incised.  The dense adhesions from the fundus of the uterus to the anterior abdominal wall was then cauterized and incised.  The attachments to the left adnexa were then cauterized and  incised.  The uterus was free at this point in time.  There was no active bleeding.  At this point in time, the abdomen was deflated of carbon dioxide.  Laparoscope was then removed.  The patient's legs were repositioned.  The Hulka tenaculum was then removed.  Weighted speculum was placed in the vaginal vault.  The cervix grasped with a tenaculum.  Cul-de-sac was entered sharply.  Both uterosacral ligaments were clamped, cut and suture ligated with 0 Vicryl.  The  reflection of the vaginal mucosa anteriorly was incised and bladder was dissected superiorly.  Paracervical tissue was clamped, cut and suture ligated with 0 Vicryl.  The vesicouterine space was identified and entered sharply.  Using the clamp, cut, tie  technique with suture ligatures of 0 Vicryl, the parametrium was serially separated from the sides of the uterus.  The uterus was then flipped.  The remaining pedicles were clamped and cut.  The uterus, right tube and  ovary were passed off the operative  field.  Held pedicles secured with free ties of 0 Vicryl and suture ligature of 0 Vicryl.  Areas of bleeding from the right side of the cuff was brought under control with figure-of-eight of 0 Vicryl.  Posterior vaginal cuff was run with a  running  locking suture of 0 Vicryl.  Vaginal mucosa was closed with interrupted figure-of-eights of 0 Vicryl.  Cystoscopy was then performed.  The bladder was uninjured.  Both ureteral orifices were identified and spilling copious amounts of clear urine.  Foley was placed to straight drain.  A sponge-on-sponge stick was placed in the vaginal vault.  Laparoscopic evaluation was then undertaken.  She had minimal bleeding noted.  This was brought under control with the EnSeal.  At the end of the procedure, we had good hemostasis.  We thoroughly irrigated the pelvis, removed all the irrigation.  No  active bleeding was encountered.  No evidence of injury to adjacent organs.  The abdomen was deflated of carbon dioxide.  All trocars were removed.  Subumbilical fascia was closed with 2 figure-of-eights of 0 Vicryl.  Skin was closed with interrupted  subcuticulars of 4-0 Vicryl and Dermabond.  The suprapubic incision was closed with Dermabond.  Sponge-on-sponge stick was then removed.    The patient was taken out of dorsal lithotomy position.  Once alert and extubated, transferred to the recovery room in good condition.    Sponge, instrument and needle count was correct by circulating nurse x2.  AN/NUANCE  D:01/10/2019 T:01/10/2019 JOB:[id number]

## 2019-01-11 DIAGNOSIS — Z1502 Genetic susceptibility to malignant neoplasm of ovary: Secondary | ICD-10-CM | POA: Diagnosis not present

## 2019-01-11 LAB — CBC
HCT: 34.9 % — ABNORMAL LOW (ref 36.0–46.0)
Hemoglobin: 11.3 g/dL — ABNORMAL LOW (ref 12.0–15.0)
MCH: 29.6 pg (ref 26.0–34.0)
MCHC: 32.4 g/dL (ref 30.0–36.0)
MCV: 91.4 fL (ref 80.0–100.0)
Platelets: 198 10*3/uL (ref 150–400)
RBC: 3.82 MIL/uL — ABNORMAL LOW (ref 3.87–5.11)
RDW: 14.4 % (ref 11.5–15.5)
WBC: 12.2 10*3/uL — ABNORMAL HIGH (ref 4.0–10.5)
nRBC: 0 % (ref 0.0–0.2)

## 2019-01-11 MED ORDER — OXYCODONE-ACETAMINOPHEN 5-325 MG PO TABS
ORAL_TABLET | ORAL | Status: AC
  Filled 2019-01-11: qty 1

## 2019-01-11 MED ORDER — OXYCODONE-ACETAMINOPHEN 7.5-325 MG PO TABS: 1.0000 | ORAL_TABLET | ORAL | 0 refills | Status: DC | PRN

## 2019-01-11 NOTE — Progress Notes (Signed)
1 Day Post-Op Procedure(s) (LRB): LAPAROSCOPIC ASSISTED VAGINAL HYSTERECTOMY WITH RIGHT SALPINGO OOPHORECTOMY (N/A) CYSTOSCOPY (N/A)  Subjective: Patient reports tolerating PO and no problems voiding.    Objective: I have reviewed patient's vital signs, intake and output and labs.  General: alert GI: soft, non-tender; bowel sounds normal; no masses,  no organomegaly inc clear  Assessment: s/p Procedure(s): LAPAROSCOPIC ASSISTED VAGINAL HYSTERECTOMY WITH RIGHT SALPINGO OOPHORECTOMY (N/A) CYSTOSCOPY (N/A): stable  Plan: Discharge home  LOS: 0 days    Pamela Luna 01/11/2019, 8:51 AM

## 2019-01-11 NOTE — Discharge Summary (Signed)
Patient name Ethelean, Colla DICTATION# [id number] CSN# [id number]  [name], MD 01/11/2019 8:56 AM

## 2019-01-11 NOTE — Anesthesia Postprocedure Evaluation (Signed)
Anesthesia Post Note  Patient: Pamela Luna  Procedure(s) Performed: LAPAROSCOPIC ASSISTED VAGINAL HYSTERECTOMY WITH RIGHT SALPINGO OOPHORECTOMY (N/A ) CYSTOSCOPY (N/A )     Patient location during evaluation: PACU Anesthesia Type: General Level of consciousness: awake and alert Pain management: pain level controlled Vital Signs Assessment: post-procedure vital signs reviewed and stable Respiratory status: spontaneous breathing, nonlabored ventilation, respiratory function stable and patient connected to nasal cannula oxygen Cardiovascular status: blood pressure returned to baseline and stable Postop Assessment: no apparent nausea or vomiting Anesthetic complications: no    Last Vitals:  Vitals:   01/11/19 0400 01/11/19 0829  BP: 112/75 (!) 96/56  Pulse: 80 82  Resp: 18 18  Temp: 36.7 C 36.7 C  SpO2: 95% 96%    Last Pain:  Vitals:   01/11/19 0829  TempSrc:   PainSc: [place]

## 2019-01-12 ENCOUNTER — Encounter (HOSPITAL_BASED_OUTPATIENT_CLINIC_OR_DEPARTMENT_OTHER): Payer: Self-pay | Admitting: Obstetrics and Gynecology

## 2019-01-13 NOTE — Discharge Summary (Signed)
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  PROCEDURE:  Laparoscopic-assisted vaginal hysterectomy, removal of right tube and right ovary and fallopian tube.  PHYSICAL EXAMINATION:  Dictated H and [place] COURSE:  The patient on ____ extremely well.  Postop, she did extremely well.  Her first postop day, she was afebrile with stable vital signs.  Her hemoglobin was 11.3.  She was voiding and tolerating a diet.  Incision was ____ and clear.   Minimal vaginal bleeding was noted.  The patient will be discharged home ____.  None were encountered during her stay in the hospital.  The patient will be discharged home in stable condition.  DISPOSITION:  The patient is to avoid heavy lifting, vaginal entrance or driving a car.  She was instructed to call should there be fever, nausea, vomiting, heavy bleeding or excessive pain.  Also instructed in signs and symptoms of deep venous  thrombosis and pulmonary embolus.  She will be discharged home on Percocet as needed for pain.  Planned follow up in the office ____.     LN/NUANCE D:01/11/2019 T:01/11/2019 JOB:[id number]

## 2019-03-04 ENCOUNTER — Encounter: Payer: Self-pay | Admitting: Gastroenterology

## 2019-03-09 ENCOUNTER — Other Ambulatory Visit: Payer: Self-pay

## 2019-03-16 ENCOUNTER — Encounter: Payer: Self-pay | Admitting: Gastroenterology

## 2019-03-25 ENCOUNTER — Telehealth (INDEPENDENT_AMBULATORY_CARE_PROVIDER_SITE_OTHER): Payer: BC Managed Care – PPO | Admitting: Gastroenterology

## 2019-03-25 ENCOUNTER — Other Ambulatory Visit: Payer: Self-pay

## 2019-03-25 VITALS — Ht 67.0 in | Wt 180.0 lb

## 2019-03-25 DIAGNOSIS — Z1509 Genetic susceptibility to other malignant neoplasm: Secondary | ICD-10-CM | POA: Diagnosis not present

## 2019-03-25 DIAGNOSIS — K219 Gastro-esophageal reflux disease without esophagitis: Secondary | ICD-10-CM | POA: Diagnosis not present

## 2019-03-25 MED ORDER — PANTOPRAZOLE SODIUM 40 MG PO TBEC: 40.0000 mg | DELAYED_RELEASE_TABLET | Freq: Every day | ORAL | 11 refills | Status: DC

## 2019-03-25 MED ORDER — SUPREP BOWEL PREP KIT 17.5-3.13-1.6 GM/177ML PO SOLN: 1.0000 | ORAL | 0 refills | Status: DC

## 2019-03-25 NOTE — Progress Notes (Signed)
Chief Complaint: New diagnosis for Lynch syndrome  Referring Provider:  Marco Collie, MD      ASSESSMENT AND PLAN;   #1. 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 FH colon ca (mom at age 59, stomach Ca MGM)  #2. GERD with small HH  Plan: -Proceed with EGD/colon in Oct 2020. Discussed risks & benefits. (Risks including rare perforation req laparotomy, bleeding after biopsies/polypectomy req blood transfusion, rare chance of missing neoplasms, risks of anesthesia/sedation). Benefits outweigh the risks. Patient agrees to proceed. All the questions were answered. Consent forms given for review. -Change Nexium to Protonix 40 mg p.o. once a day half an hour before breakfast. 30, 11 refills.    HPI:    Pamela Luna is a 58 y.o. female  Dx as having Lynch syndrome with positive PMS2 mutation. No significant GI complaints except for heartburn despite Nexium.  No odynophagia or dysphagia. Has mild constipation intermittently.  Better with increased water intake. Has started baby aspirin once a day.  No nausea, vomiting, odynophagia or dysphagia.  No significant diarrhea.  There is no melena or hematochezia. No unintentional weight loss.  Highly motivated.  Starting exercise like walking with niece.   Past GI procedures: -colon 03/2017 ([facility]): neg except hoids. -EGD12/2013: small HH, mild gastritis, neg SBx. Past Medical History:  Diagnosis Date  . Constipation   . FHx: migraine headaches   . GERD (gastroesophageal reflux disease)   . H/O seasonal allergies   . Heart murmur    H/O   . History of Helicobacter pylori infection   . Hypertension   . IBS (irritable bowel syndrome)   . Osteopenia   . Osteoporosis   . Ovarian cyst     Past Surgical History:  Procedure Laterality Date  . CESAREAN SECTION  1998  . COLONOSCOPY  04/15/2017   Small internal hermorrhoids. Otherwise normal colonoscopy.   . CYSTOSCOPY N/A 01/10/2019   Procedure: CYSTOSCOPY;  Surgeon:  [name], MD;  Location: [facility];  Service: Gynecology;  Laterality: N/A;  . ESOPHAGOGASTRODUODENOSCOPY  06/30/2012   Small hiatal hernia. Mild gastritis.   Marland Kitchen LAPAROSCOPIC VAGINAL HYSTERECTOMY WITH SALPINGO OOPHORECTOMY N/A 01/10/2019   Procedure: LAPAROSCOPIC ASSISTED VAGINAL HYSTERECTOMY WITH RIGHT SALPINGO OOPHORECTOMY;  Surgeon: [name], MD;  Location: [facility];  Service: Gynecology;  Laterality: N/A;  . LEFT OOPHORECTOMY  04/20/12  . SCALP CYST EXCISION  2006 - 2009  . TONSILLECTOMY  1966    Family History  Problem Relation Age of Onset  . Cancer Mother 71       History of Colon Cancer 2004  . Diabetes Father   . Arthritis Father   . Heart disease Father   . Cancer Maternal Uncle   . Cancer Maternal Grandmother   . Cancer Maternal Uncle   . Cancer Cousin   . Cancer Other        grant grandmother had breast cancer     Social History   Tobacco Use  . Smoking status: Former Smoker    Packs/day: 0.25    Years: 16.00    Pack years: 4.00    Quit date: 12/15/2001    Years since quitting: 17.2  . Smokeless tobacco: Never Used  Substance Use Topics  . Alcohol use: Yes    Comment: Rarely  . Drug use: No    Current Outpatient Medications  Medication Sig Dispense Refill  . aspirin 81 MG chewable tablet Chew 81 mg by mouth daily.    Marland Kitchen  calcium carbonate (OSCAL) 1500 (600 Ca) MG TABS tablet Take 600 mg by mouth daily.     . Cholecalciferol (VITAMIN D) 50 MCG (2000 UT) CAPS Take 2,000 Units by mouth daily.     Marland Kitchen denosumab (PROLIA) 60 MG/ML SOSY injection Inject 60 mg into the skin every 6 (six) months. Last shot was 12/2018    . esomeprazole (NEXIUM) 20 MG capsule Take 20 mg by mouth daily.     . hydrOXYzine (ATARAX/VISTARIL) 25 MG tablet Take 25 mg by mouth 3 (three) times daily as needed for anxiety.    Marland Kitchen lisinopril-hydrochlorothiazide (PRINZIDE,ZESTORETIC) 10-12.5 MG tablet Take 1 tablet by mouth 2 (two) times daily.     . Multiple  Vitamins-Minerals (MULTIVITAMIN WITH MINERALS) tablet Take 1 tablet by mouth daily.    . pravastatin (PRAVACHOL) 10 MG tablet Take 10 mg by mouth 2 (two) times a week. Wed and Sat    . venlafaxine XR (EFFEXOR-XR) 150 MG 24 hr capsule Take 150 mg by mouth daily.    . verapamil (VERELAN PM) 180 MG 24 hr capsule Take 180 mg by mouth at bedtime.     No current facility-administered medications for this visit.     No Known Allergies  Review of Systems:  Constitutional: Denies fever, chills, diaphoresis, appetite change and fatigue.  HEENT: Denies photophobia, eye pain, redness, hearing loss, ear pain, congestion, sore throat, rhinorrhea, sneezing, mouth sores, neck pain, neck stiffness and tinnitus.   Respiratory: Denies SOB, DOE, cough, chest tightness,  and wheezing.   Cardiovascular: Denies chest pain, palpitations and leg swelling.  Genitourinary: Denies dysuria, urgency, frequency, hematuria, flank pain and difficulty urinating.  Musculoskeletal: Denies myalgias, back pain, joint swelling, arthralgias and gait problem.  Skin: No rash.  Neurological: Denies dizziness, seizures, syncope, weakness, light-headedness, numbness and headaches.  Hematological: Denies adenopathy. Easy bruising, personal or family bleeding history  Psychiatric/Behavioral: No anxiety or depression     Physical Exam:    Ht _0  (1.702 m)   Wt 180 lb (81.6 kg)   BMI 28.19 kg/m  Filed Weights   03/25/19 0808  Weight: 180 lb (81.6 kg)   Constitutional:  Well-developed, in no acute distress. Psychiatric: Normal mood and affect. Behavior is normal. Tele visit  Data Reviewed: I have personally reviewed following labs and imaging studies  CBC: CBC Latest Ref Rng & Units 01/11/2019 01/03/2019 04/16/2012  WBC 4.0 - 10.5 K/uL 12.2(H) 5.6 5.5  Hemoglobin 12.0 - 15.0 g/dL 11.3(L) 13.4 14.1  Hematocrit 36.0 - 46.0 % 34.9(L) 41.4 41.0  Platelets 150 - 400 K/uL 198 211 220    CMP: CMP Latest Ref Rng & Units  01/03/2019 04/16/2012 03/14/2012  Glucose 70 - 99 mg/dL 113(H) 89 103(H)  BUN 6 - 20 mg/dL _1 Creatinine 0.44 - 1.00 mg/dL 0.65 0.62 0.73  Sodium 135 - 145 mmol/L 139 138 137  Potassium 3.5 - 5.1 mmol/L 4.4 4.4 4.9  Chloride 98 - 111 mmol/L 102 100 97  CO2 22 - 32 mmol/L 30 29 32  Calcium 8.9 - 10.3 mg/dL 9.4 9.5 10.2  Total Protein 6.5 - 8.1 g/dL 7.4 7.4 7.3  Total Bilirubin 0.3 - 1.2 mg/dL 0.1(L) 0.3 0.6  Alkaline Phos 38 - 126 U/L 51 52 57  AST 15 - 41 U/L _2 ALT 0 - 44 U/L _3 This service was provided via telemedicine.  The patient was located at home.  The provider was located in office.  The  patient did consent to this telephone visit and is aware of possible charges through their insurance for this visit.  The patient was referred by Dr. Nyra Capes.   Time spent on call/coordination of care: 25 min     Carmell Austria, MD 03/25/2019, 8:46 AM  Cc: Marco Collie, MD

## 2019-03-25 NOTE — Patient Instructions (Signed)
If you are age 59 or older, your body mass index should be between 23-30. Your Body mass index is 28.19 kg/m. If this is out of the aforementioned range listed, please consider follow up with your Primary Care Provider.  If you are age 41 or younger, your body mass index should be between 19-25. Your Body mass index is 28.19 kg/m. If this is out of the aformentioned range listed, please consider follow up with your Primary Care Provider.   You have been scheduled for an endoscopy and colonoscopy. Please follow the written instructions given to you at your visit today. Please pick up your prep supplies at the pharmacy within the next 1-3 days. If you use inhalers (even only as needed), please bring them with you on the day of your procedure. Your physician has requested that you go to www.startemmi.com and enter the access code given to you at your visit today. This web site gives a general overview about your procedure. However, you should still follow specific instructions given to you by our office regarding your preparation for the procedure.  We have sent the following medications to your pharmacy for you to pick up at your convenience: Suprep Protonix   Thank you,  Dr. Jackquline Denmark

## 2019-04-13 ENCOUNTER — Encounter: Payer: Self-pay | Admitting: Gastroenterology

## 2019-04-25 ENCOUNTER — Telehealth: Payer: Self-pay

## 2019-04-25 NOTE — Telephone Encounter (Signed)
Covid-19 screening questions   Do you now or have you had a fever in the last 14 days?  Do you have any respiratory symptoms of shortness of breath or cough now or in the last 14 days?  Do you have any family members or close contacts with diagnosed or suspected Covid-19 in the past 14 days?  Have you been tested for Covid-19 and found to be positive?       

## 2019-04-25 NOTE — Telephone Encounter (Signed)
Pt returned call and answered NO to the Covid-19 screening questions

## 2019-04-26 ENCOUNTER — Ambulatory Visit (AMBULATORY_SURGERY_CENTER): Payer: BC Managed Care – PPO | Admitting: Gastroenterology

## 2019-04-26 ENCOUNTER — Other Ambulatory Visit: Payer: Self-pay

## 2019-04-26 ENCOUNTER — Encounter: Payer: Self-pay | Admitting: Gastroenterology

## 2019-04-26 VITALS — BP 124/88 | HR 64 | Temp 98.7°F | Resp 14 | Ht 67.0 in | Wt 180.0 lb

## 2019-04-26 DIAGNOSIS — D122 Benign neoplasm of ascending colon: Secondary | ICD-10-CM

## 2019-04-26 DIAGNOSIS — Z1509 Genetic susceptibility to other malignant neoplasm: Secondary | ICD-10-CM

## 2019-04-26 DIAGNOSIS — Z800 Family history of malignant neoplasm of digestive organs: Secondary | ICD-10-CM

## 2019-04-26 DIAGNOSIS — D123 Benign neoplasm of transverse colon: Secondary | ICD-10-CM

## 2019-04-26 DIAGNOSIS — K449 Diaphragmatic hernia without obstruction or gangrene: Secondary | ICD-10-CM | POA: Diagnosis not present

## 2019-04-26 DIAGNOSIS — K219 Gastro-esophageal reflux disease without esophagitis: Secondary | ICD-10-CM

## 2019-04-26 HISTORY — PX: UPPER GASTROINTESTINAL ENDOSCOPY: SHX188

## 2019-04-26 MED ORDER — SODIUM CHLORIDE 0.9 % IV SOLN: 500.0000 mL | Freq: Once | INTRAVENOUS | Status: DC

## 2019-04-26 NOTE — Op Note (Signed)
[facility] Patient Name: Pamela Luna Procedure Date: 04/26/2019 1:25 PM MRN: [id number] Endoscopist: Jackquline Denmark , MD Age: 58 Referring MD:  Date of Birth: 1960/08/23 Gender: Female Account #: [id number] Procedure:                Upper GI endoscopy Indications:              Lynch syndrome Medicines:                Monitored Anesthesia Care Procedure:                Pre-Anesthesia Assessment:                           - Prior to the procedure, a History and Physical                            was performed, and patient medications and                            allergies were reviewed. The patient's tolerance of                            previous anesthesia was also reviewed. The risks                            and benefits of the procedure and the sedation                            options and risks were discussed with the patient.                            All questions were answered, and informed consent                            was obtained. Prior Anticoagulants: The patient has                            taken no previous anticoagulant or antiplatelet                            agents. ASA Grade Assessment: II - A patient with                            mild systemic disease. After reviewing the risks                            and benefits, the patient was deemed in                            satisfactory condition to undergo the procedure.                           After obtaining informed consent, the endoscope was  passed under direct vision. Throughout the                            procedure, the patient's blood pressure, pulse, and                            oxygen saturations were monitored continuously. The                            Endoscope was introduced through the mouth, and                            advanced to the second part of duodenum. The upper                            GI endoscopy was accomplished without  difficulty.                            The patient tolerated the procedure well. Scope In: Scope Out: Findings:                 The examined esophagus was normal.                           The Z-line was regular and was found 35 cm from the                            incisors. Examined by NBI.                           A 2 cm hiatal hernia was present.                           The entire examined stomach was normal. Examined by                            NBI.                           The examined duodenum was normal. Complications:            No immediate complications. Estimated Blood Loss:     Estimated blood loss: none. Impression:               - Small hiatal hernia.                           - Otherwise normal EGD. Recommendation:           - Patient has a contact number available for                            emergencies. The signs and symptoms of potential                            delayed complications were discussed with the  patient. Return to normal activities tomorrow.                            Written discharge instructions were provided to the                            patient.                           - Resume previous diet.                           - Continue present medications.                           - Repeat upper endoscopy in 2 years for screening                            purposes. Jackquline Denmark, MD 04/26/2019 2:07:37 PM This report has been signed electronically.

## 2019-04-26 NOTE — Op Note (Signed)
[facility] Patient Name: Pamela Luna Procedure Date: 04/26/2019 1:25 PM MRN: [id number] Endoscopist: Jackquline Denmark , MD Age: 58 Referring MD:  Date of Birth: 1960/11/11 Gender: Female Account #: [id number] Procedure:                Colonoscopy Indications:              Screening in patient at increased risk: Lynch                            syndrome with positive PMS2 heterozygote, family                            history of colon cancer. Medicines:                Monitored Anesthesia Care Procedure:                Pre-Anesthesia Assessment:                           - Prior to the procedure, a History and Physical                            was performed, and patient medications and                            allergies were reviewed. The patient's tolerance of                            previous anesthesia was also reviewed. The risks                            and benefits of the procedure and the sedation                            options and risks were discussed with the patient.                            All questions were answered, and informed consent                            was obtained. Prior Anticoagulants: The patient has                            taken no previous anticoagulant or antiplatelet                            agents. ASA Grade Assessment: II - A patient with                            mild systemic disease. After reviewing the risks                            and benefits, the patient was deemed in  satisfactory condition to undergo the procedure.                           After obtaining informed consent, the colonoscope                            was passed under direct vision. Throughout the                            procedure, the patient's blood pressure, pulse, and                            oxygen saturations were monitored continuously. The                            Colonoscope was introduced through the  anus and                            advanced to the 2 cm into the ileum. The                            colonoscopy was performed without difficulty. The                            patient tolerated the procedure well. The quality                            of the bowel preparation was good. The terminal                            ileum, ileocecal valve, appendiceal orifice, and                            rectum were photographed. Scope In: 1:36:51 PM Scope Out: 2:01:30 PM Scope Withdrawal Time: 0 hours 16 minutes 31 seconds  Total Procedure Duration: 0 hours 24 minutes 39 seconds  Findings:                 Three sessile polyps were found in the proximal                            transverse colon, mid transverse colon and                            ascending colon. The polyps were 4 to 6 mm in size.                            These polyps were removed with a cold snare.                            Resection and retrieval were complete. Estimated                            blood loss: none.  Non-bleeding internal hemorrhoids were found during                            retroflexion. The hemorrhoids were small.                           The terminal ileum appeared normal.                           The exam was otherwise without abnormality on                            direct and retroflexion views. Complications:            No immediate complications. Estimated Blood Loss:     Estimated blood loss: none. Impression:               -Colonic polyps S/P polypectomy.                           -Otherwise normal colonoscopy to TI. Recommendation:           - Patient has a contact number available for                            emergencies. The signs and symptoms of potential                            delayed complications were discussed with the                            patient. Return to normal activities tomorrow.                            Written discharge  instructions were provided to the                            patient.                           - Resume previous diet.                           - Continue present medications.                           - Await pathology results.                           - Repeat colonoscopy in 1 year for screening                            purposes.                           - Return to GI clinic PRN.                           -  D/W [name]. Jackquline Denmark, MD 04/26/2019 2:13:28 PM This report has been signed electronically.

## 2019-04-26 NOTE — Progress Notes (Signed)
Vitals-CW Temp-June  History reviewed.

## 2019-04-26 NOTE — Progress Notes (Signed)
Called to room to assist during endoscopic procedure.  Patient ID and intended procedure confirmed with present staff. Received instructions for my participation in the procedure from the performing physician.  

## 2019-04-26 NOTE — Progress Notes (Signed)
PT taken to PACU. Monitors in place. VSS. Report given to RN. 

## 2019-04-26 NOTE — Patient Instructions (Signed)
Please read handouts provided. Continue present medications. Await pathology results. Repeat colonoscopy in one year for screening purposes. Return to GI clinic as needed.      YOU HAD AN ENDOSCOPIC PROCEDURE TODAY AT [facility]:   Refer to the procedure report that was given to you for any specific questions about what was found during the examination.  If the procedure report does not answer your questions, please call your gastroenterologist to clarify.  If you requested that your care partner not be given the details of your procedure findings, then the procedure report has been included in a sealed envelope for you to review at your convenience later.  YOU SHOULD EXPECT: Some feelings of bloating in the abdomen. Passage of more gas than usual.  Walking can help get rid of the air that was put into your GI tract during the procedure and reduce the bloating. If you had a lower endoscopy (such as a colonoscopy or flexible sigmoidoscopy) you may notice spotting of blood in your stool or on the toilet paper. If you underwent a bowel prep for your procedure, you may not have a normal bowel movement for a few days.  Please Note:  You might notice some irritation and congestion in your nose or some drainage.  This is from the oxygen used during your procedure.  There is no need for concern and it should clear up in a day or so.  SYMPTOMS TO REPORT IMMEDIATELY:   Following lower endoscopy (colonoscopy or flexible sigmoidoscopy):  Excessive amounts of blood in the stool  Significant tenderness or worsening of abdominal pains  Swelling of the abdomen that is new, acute  Fever of 100F or higher   Following upper endoscopy (EGD)  Vomiting of blood or coffee ground material  New chest pain or pain under the shoulder blades  Painful or persistently difficult swallowing  New shortness of breath  Fever of 100F or higher  Black, tarry-looking stools  For urgent or emergent  issues, a gastroenterologist can be reached at any hour by calling [phone].   DIET:  We do recommend a small meal at first, but then you may proceed to your regular diet.  Drink plenty of fluids but you should avoid alcoholic beverages for 24 hours.  ACTIVITY:  You should plan to take it easy for the rest of today and you should NOT DRIVE or use heavy machinery until tomorrow (because of the sedation medicines used during the test).    FOLLOW UP: Our staff will call the number listed on your records 48-72 hours following your procedure to check on you and address any questions or concerns that you may have regarding the information given to you following your procedure. If we do not reach you, we will leave a message.  We will attempt to reach you two times.  During this call, we will ask if you have developed any symptoms of COVID 19. If you develop any symptoms (ie: fever, flu-like symptoms, shortness of breath, cough etc.) before then, please call [phone].  If you test positive for Covid 19 in the 2 weeks post procedure, please call and report this information to [place].    If any biopsies were taken you will be contacted by phone or by letter within the next 1-3 weeks.  Please call us at [phone] if you have not heard about the biopsies in 3 weeks.    SIGNATURES/CONFIDENTIALITY: You and/or your care partner have signed paperwork which will  be entered into your electronic medical record.  These signatures attest to the fact that that the information above on your After Visit Summary has been reviewed and is understood.  Full responsibility of the confidentiality of this discharge information lies with you and/or your care-partner. 

## 2019-04-28 ENCOUNTER — Telehealth: Payer: Self-pay

## 2019-04-28 NOTE — Telephone Encounter (Signed)
  Follow up Call-  Call back number 04/26/2019  Post procedure Call Back phone  # [phone]  Permission to leave phone message Yes  Some recent data might be hidden     Patient questions:  Do you have a fever, pain , or abdominal swelling? No. Pain Score  0 *  Have you tolerated food without any problems? Yes.    Have you been able to return to your normal activities? Yes.    Do you have any questions about your discharge instructions: Diet   No. Medications  No. Follow up visit  No.  Do you have questions or concerns about your Care? No.  Actions: * If pain score is 4 or above: No action needed, pain <4.  1. Have you developed a fever since your procedure? no  2.   Have you had an respiratory symptoms (SOB or cough) since your procedure? no  3.   Have you tested positive for COVID 19 since your procedure no  4.   Have you had any family members/close contacts diagnosed with the COVID 19 since your procedure?  no   If yes to any of these questions please route to Joylene John, RN and Alphonsa Gin, Therapist, sports.

## 2019-05-01 ENCOUNTER — Encounter: Payer: Self-pay | Admitting: Gastroenterology

## 2020-03-27 ENCOUNTER — Other Ambulatory Visit: Payer: Self-pay | Admitting: Gastroenterology

## 2020-03-27 ENCOUNTER — Other Ambulatory Visit: Payer: Self-pay

## 2020-03-27 MED ORDER — PANTOPRAZOLE SODIUM 40 MG PO TBEC: 40.0000 mg | DELAYED_RELEASE_TABLET | Freq: Every day | ORAL | 0 refills | Status: DC

## 2020-04-26 ENCOUNTER — Other Ambulatory Visit: Payer: Self-pay | Admitting: Gastroenterology

## 2020-06-02 ENCOUNTER — Other Ambulatory Visit: Payer: Self-pay | Admitting: Gastroenterology

## 2020-06-25 ENCOUNTER — Telehealth: Payer: Self-pay | Admitting: Gastroenterology

## 2020-06-25 NOTE — Telephone Encounter (Signed)
Spoke to patient who is requesting to add her 2 year EGD on to her 1 year colonoscopy as she is already behind. EGD/Colonoscopy scheduled for Lynch Syndrome screening.

## 2020-07-05 ENCOUNTER — Other Ambulatory Visit: Payer: Self-pay | Admitting: Gastroenterology

## 2020-08-06 ENCOUNTER — Ambulatory Visit (AMBULATORY_SURGERY_CENTER): Payer: Self-pay | Admitting: *Deleted

## 2020-08-06 ENCOUNTER — Other Ambulatory Visit: Payer: Self-pay

## 2020-08-06 VITALS — Ht 67.0 in | Wt 183.0 lb

## 2020-08-06 DIAGNOSIS — Z86010 Personal history of colonic polyps: Secondary | ICD-10-CM

## 2020-08-06 DIAGNOSIS — Z1509 Genetic susceptibility to other malignant neoplasm: Secondary | ICD-10-CM

## 2020-08-06 MED ORDER — SUTAB 1479-225-188 MG PO TABS: 1.0000 | ORAL_TABLET | ORAL | 0 refills | Status: DC

## 2020-08-06 NOTE — Progress Notes (Signed)
Patient is here in-person for PV. Patient denies any allergies to eggs or soy. Patient denies any problems with anesthesia/sedation. Patient denies any oxygen use at home. Patient denies taking any diet/weight loss medications or blood thinners. Patient is not being treated for MRSA or C-diff. Patient is aware of our care-partner policy and [facility]-40 safety protocol.   COVID-19 vaccines completed on 06/25/20 booster, per patient.   Prep Prescription coupon given to the patient. Pt requested pills for prep.

## 2020-08-13 ENCOUNTER — Encounter: Payer: Self-pay | Admitting: Gastroenterology

## 2020-08-14 IMAGING — US US THYROID
1 series · 13 of 25 positions shown · non-contrast
Comparison: 01/27/2017

CLINICAL DATA: Follow-up thyroid nodule.

EXAM:
THYROID ULTRASOUND
TECHNIQUE: Ultrasound examination of the thyroid gland and adjacent soft
tissues was performed.

[Series 1: us thyroid · 0.05mm/px · 13 of 46 slices shown]
[im 1/46]
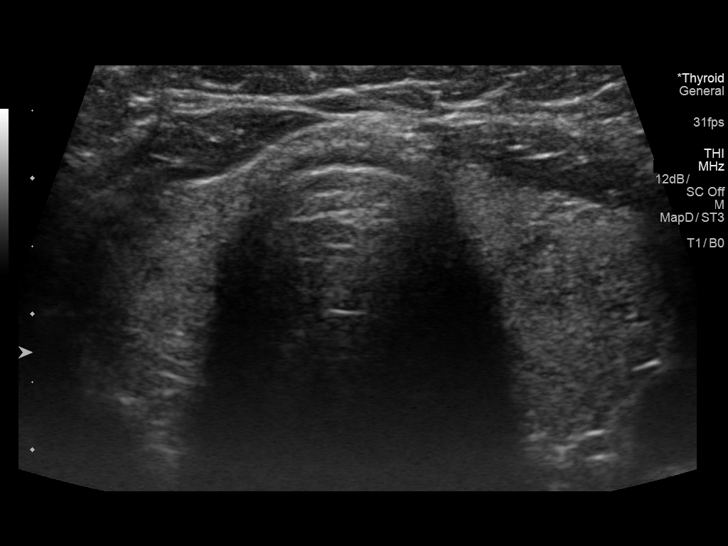
[im 4/46]
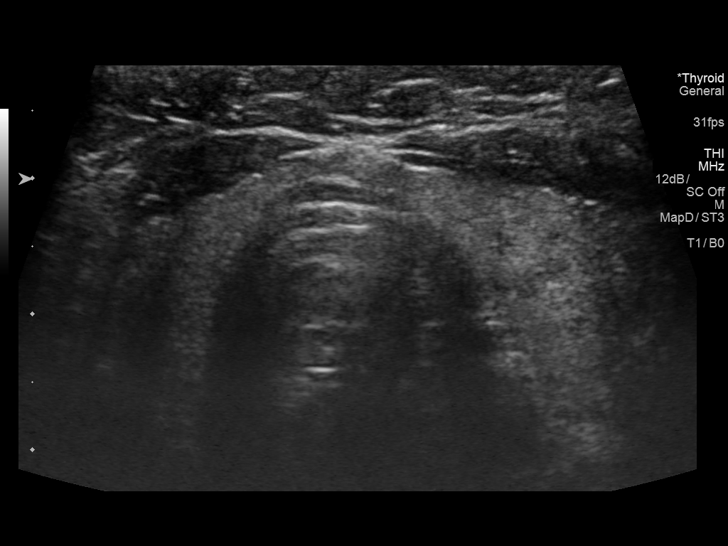
[im 8/46]
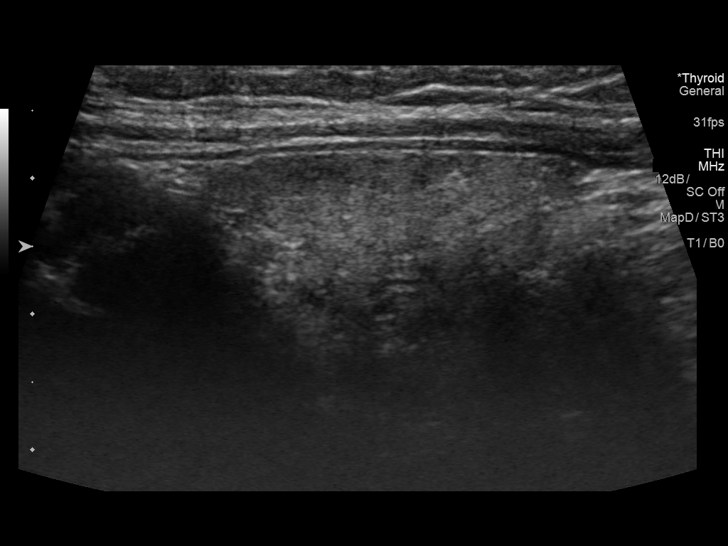
[im 12/46]
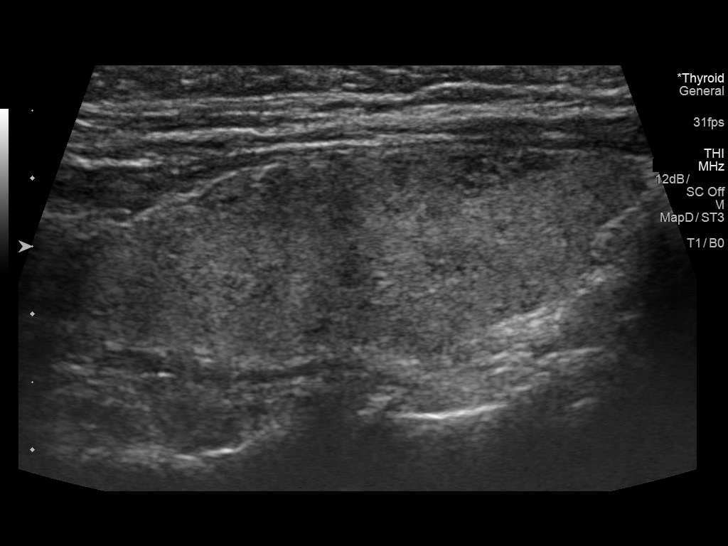
[im 16/46]
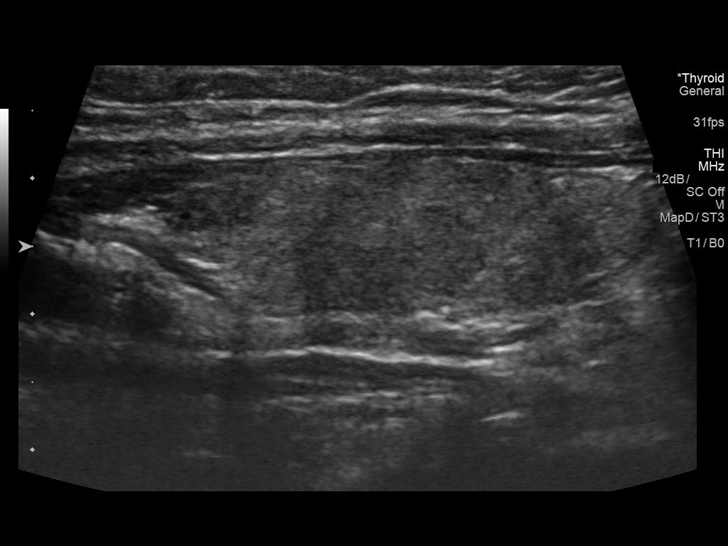
[im 19/46]
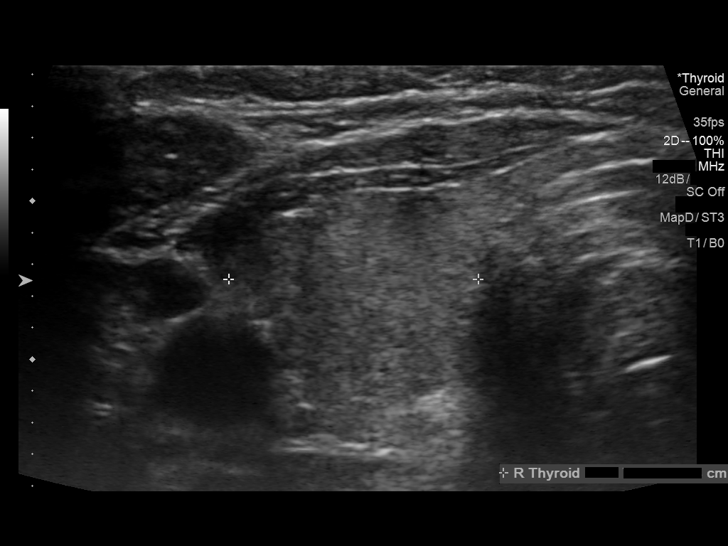
[im 23/46]
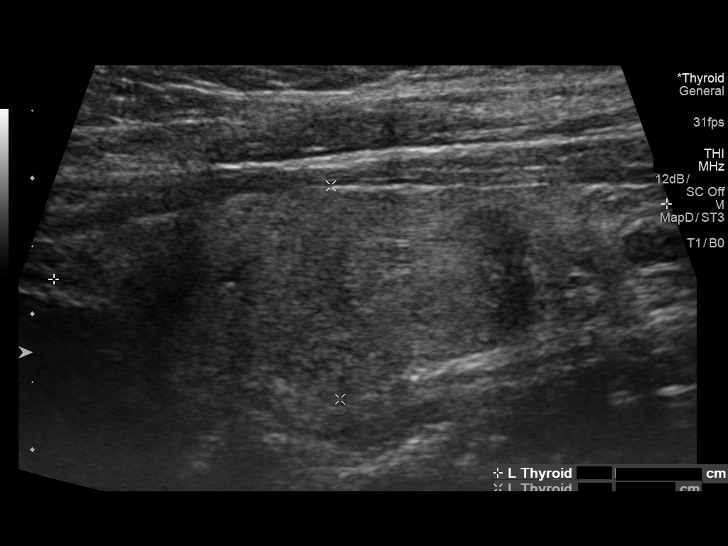
[im 27/46]
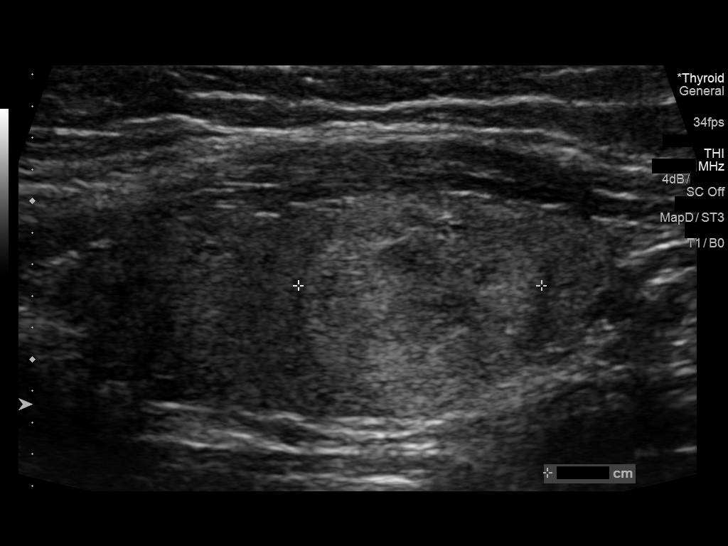
[im 31/46]
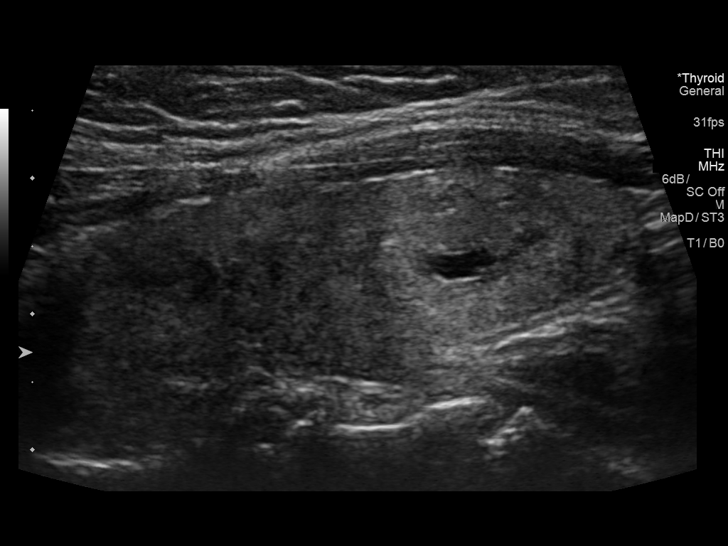
[im 34/46]
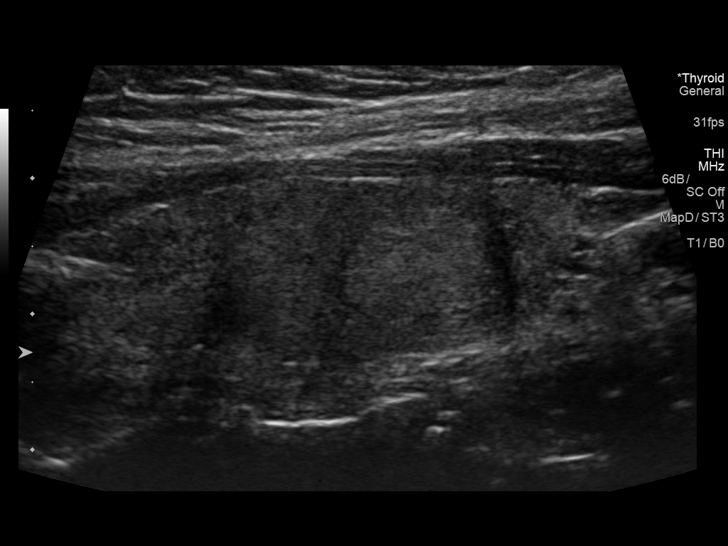
[im 38/46]
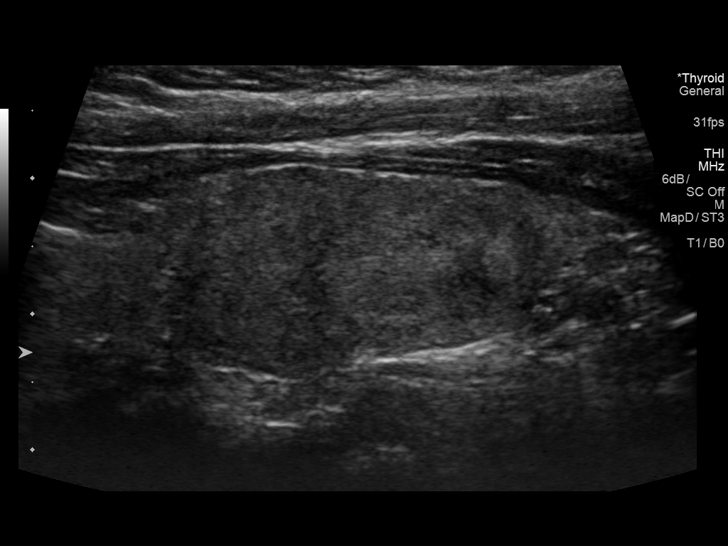
[im 42/46]
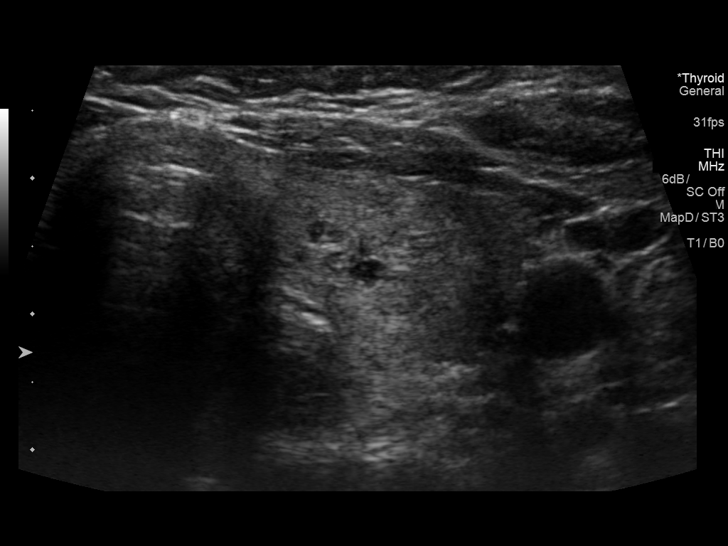
[im 46/46]
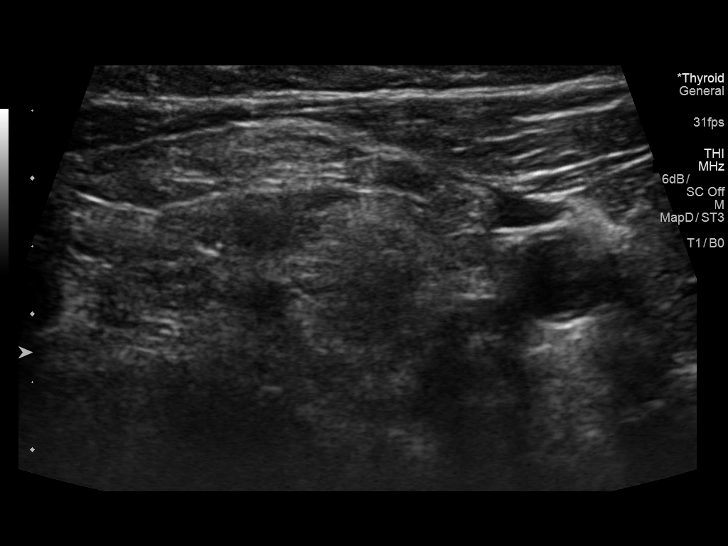

[13 of 25 positions shown; findings below may reference images not displayed]

FINDINGS: Parenchymal Echotexture: Mildly heterogenous

Isthmus: 0.3 cm, previously 0.2 cm

Right lobe: 4.5 x 1.6 x 1.6 cm, previously 5.1 x 1.2 x 1.5 cm

Left lobe: 4.6 x 1.6 x 2.0 cm, previously 4.7 x 1.4 x 2.0 cm

_________________________________________________________

Estimated total number of nodules >/= 1 cm: 1

Number of spongiform nodules >/=  2 cm not described below (TR1): 0

Number of mixed cystic and solid nodules >/= 1.5 cm not described
below (TR2): 0

_________________________________________________________

No discrete right thyroid nodule.

Nodule # 1:

Prior biopsy: No

Location: Left; Inferior

Maximum size: 1.5 cm; Other 2 dimensions: 1.2 x 1.5 cm, previously,
1.5 x 1.2 x 1.4 cm

Composition: solid/almost completely solid (2)

Echogenicity: hyperechoic (1)

Shape: not taller-than-wide (0)

Margins: ill-defined (0)

Echogenic foci: none (0)

ACR TI-RADS total points: 3.

ACR TI-RADS risk category:  TR3 (3 points).

Significant change in size (>/= 20% in two dimensions and minimal
increase of 2 mm): No

Change in features: No

Change in ACR TI-RADS risk category: No

ACR TI-RADS recommendations:

*Given size (>/= 1.5 - 2.4 cm) and appearance, a follow-up
ultrasound in 1 year should be considered based on TI-RADS criteria.

_________________________________________________________

No enlarged lymph nodes around the thyroid tissue.
IMPRESSION: Stable left thyroid nodule measuring up to 1.5 cm. This is a TR 3
nodule that meets criteria for annual follow-up until there is 5
years of stability. Nodule is stable since 3390.

No new thyroid nodule.

The above is in keeping with the ACR TI-RADS recommendations - [HOSPITAL] 8125;[DATE].

## 2020-08-16 ENCOUNTER — Encounter: Payer: Self-pay | Admitting: Gastroenterology

## 2020-08-16 ENCOUNTER — Other Ambulatory Visit: Payer: Self-pay

## 2020-08-16 ENCOUNTER — Ambulatory Visit (AMBULATORY_SURGERY_CENTER): Payer: BC Managed Care – PPO | Admitting: Gastroenterology

## 2020-08-16 VITALS — BP 140/87 | HR 68 | Temp 96.0°F | Resp 10 | Ht 67.0 in | Wt 183.0 lb

## 2020-08-16 DIAGNOSIS — Z86010 Personal history of colonic polyps: Secondary | ICD-10-CM | POA: Diagnosis not present

## 2020-08-16 DIAGNOSIS — Z1509 Genetic susceptibility to other malignant neoplasm: Secondary | ICD-10-CM | POA: Diagnosis not present

## 2020-08-16 DIAGNOSIS — K2970 Gastritis, unspecified, without bleeding: Secondary | ICD-10-CM

## 2020-08-16 DIAGNOSIS — K219 Gastro-esophageal reflux disease without esophagitis: Secondary | ICD-10-CM | POA: Diagnosis not present

## 2020-08-16 DIAGNOSIS — K449 Diaphragmatic hernia without obstruction or gangrene: Secondary | ICD-10-CM | POA: Diagnosis not present

## 2020-08-16 DIAGNOSIS — Z538 Procedure and treatment not carried out for other reasons: Secondary | ICD-10-CM

## 2020-08-16 DIAGNOSIS — K319 Disease of stomach and duodenum, unspecified: Secondary | ICD-10-CM

## 2020-08-16 MED ORDER — SODIUM CHLORIDE 0.9 % IV SOLN: 500.0000 mL | INTRAVENOUS | Status: DC

## 2020-08-16 NOTE — Progress Notes (Signed)
Emilio Math, RN went over discharge instructions with pt.  Pt could not have colon tomorrow d/t her husband's work schedule.    Rescheduled colon d/t poor pre for propofol 09-20-20 at 8:30, Previsit  09-20-20.  Pt needs a 2 day prep per Dr. Currie Paris and Clenpiq.    No problems noted in the recovery room. maw

## 2020-08-16 NOTE — Progress Notes (Signed)
Called to room to assist during endoscopic procedure.  Patient ID and intended procedure confirmed with present staff. Received instructions for my participation in the procedure from the performing physician.  

## 2020-08-16 NOTE — Progress Notes (Signed)
PT taken to PACU. Monitors in place. VSS. Report given to RN. 

## 2020-08-16 NOTE — Patient Instructions (Addendum)
Handouts were given to you on Gastritis and a Hiatal Hernia. Continue taking PROTONIX 40 mg once daily.  Best to take on an empty stomach 20-30 minutes before food. You may resume your othercurrent medications today. Await biopsy results.  May take 2-3 weeks to receive pathology results. Colonoscopy & previsit rescheduled.  Will need a 2 day prep. Please call if any questions or concerns.     YOU HAD AN ENDOSCOPIC PROCEDURE TODAY AT [facility]:   Refer to the procedure report that was given to you for any specific questions about what was found during the examination.  If the procedure report does not answer your questions, please call your gastroenterologist to clarify.  If you requested that your care partner not be given the details of your procedure findings, then the procedure report has been included in a sealed envelope for you to review at your convenience later.  YOU SHOULD EXPECT: Some feelings of bloating in the abdomen. Passage of more gas than usual.  Walking can help get rid of the air that was put into your GI tract during the procedure and reduce the bloating. If you had a lower endoscopy (such as a colonoscopy or flexible sigmoidoscopy) you may notice spotting of blood in your stool or on the toilet paper. If you underwent a bowel prep for your procedure, you may not have a normal bowel movement for a few days.  Please Note:  You might notice some irritation and congestion in your nose or some drainage.  This is from the oxygen used during your procedure.  There is no need for concern and it should clear up in a day or so.  SYMPTOMS TO REPORT IMMEDIATELY:   Following lower endoscopy (colonoscopy or flexible sigmoidoscopy):  Excessive amounts of blood in the stool  Significant tenderness or worsening of abdominal pains  Swelling of the abdomen that is new, acute  Fever of 100F or higher   Following upper endoscopy (EGD)  Vomiting of blood or coffee ground  material  New chest pain or pain under the shoulder blades  Painful or persistently difficult swallowing  New shortness of breath  Fever of 100F or higher  Black, tarry-looking stools  For urgent or emergent issues, a gastroenterologist can be reached at any hour by calling [phone]. Do not use MyChart messaging for urgent concerns.    DIET:  We do recommend a small meal at first, but then you may proceed to your regular diet.  Drink plenty of fluids but you should avoid alcoholic beverages for 24 hours.  ACTIVITY:  You should plan to take it easy for the rest of today and you should NOT DRIVE or use heavy machinery until tomorrow (because of the sedation medicines used during the test).    FOLLOW UP: Our staff will call the number listed on your records 48-72 hours following your procedure to check on you and address any questions or concerns that you may have regarding the information given to you following your procedure. If we do not reach you, we will leave a message.  We will attempt to reach you two times.  During this call, we will ask if you have developed any symptoms of COVID 19. If you develop any symptoms (ie: fever, flu-like symptoms, shortness of breath, cough etc.) before then, please call [phone].  If you test positive for Covid 19 in the 2 weeks post procedure, please call and report this information to [place].  If any biopsies were taken you will be contacted by phone or by letter within the next 1-3 weeks.  Please call us at [phone] if you have not heard about the biopsies in 3 weeks.    SIGNATURES/CONFIDENTIALITY: You and/or your care partner have signed paperwork which will be entered into your electronic medical record.  These signatures attest to the fact that that the information above on your After Visit Summary has been reviewed and is understood.  Full responsibility of the confidentiality of this discharge information lies with you and/or your  care-partner.

## 2020-08-16 NOTE — Op Note (Signed)
[facility] Patient Name: Pamela Luna Procedure Date: 08/16/2020 7:23 AM MRN: [id number] Endoscopist: Jackquline Denmark , MD Age: 60 Referring MD:  Date of Birth: 1960/12/06 Gender: Female Account #: [id number] Procedure:                Upper GI endoscopy Indications:              Screening procedure Medicines:                Monitored Anesthesia Care Procedure:                Pre-Anesthesia Assessment:                           - Prior to the procedure, a History and Physical                            was performed, and patient medications and                            allergies were reviewed. The patient's tolerance of                            previous anesthesia was also reviewed. The risks                            and benefits of the procedure and the sedation                            options and risks were discussed with the patient.                            All questions were answered, and informed consent                            was obtained. Prior Anticoagulants: The patient has                            taken no previous anticoagulant or antiplatelet                            agents. ASA Grade Assessment: II - A patient with                            mild systemic disease. After reviewing the risks                            and benefits, the patient was deemed in                            satisfactory condition to undergo the procedure.                           After obtaining informed consent, the endoscope was  passed under direct vision. Throughout the                            procedure, the patient's blood pressure, pulse, and                            oxygen saturations were monitored continuously. The                            Endoscope was introduced through the mouth, and                            advanced to the second part of duodenum. The upper                            GI endoscopy was accomplished without  difficulty.                            The patient tolerated the procedure well. Scope In: Scope Out: Findings:                 The examined esophagus was normal with healed                            distal esophageal erosions at GE junction, 35 cm                            from incisors. Multiple biopsies were obtained from                            all 4 quadrants and sent for histology directed by                            NBI                           A 2 cm hiatal hernia was present.                           Localized mild inflammation characterized by                            erythema was found in the gastric antrum. Biopsies                            were taken with a cold forceps for histology.                           The examined duodenum was normal. Complications:            No immediate complications. Estimated Blood Loss:     Estimated blood loss: none. Impression:               - 2 cm hiatal hernia.                           -  Mild gastritis. Biopsied. Recommendation:           - Patient has a contact number available for                            emergencies. The signs and symptoms of potential                            delayed complications were discussed with the                            patient. Return to normal activities tomorrow.                            Written discharge instructions were provided to the                            patient.                           - Resume previous diet.                           - Continue present medications including Protonix                            40 mg p.o. once a day.                           - Await pathology results.                           - The findings and recommendations were discussed                            with the patient's family. Jackquline Denmark, MD 08/16/2020 8:31:22 AM This report has been signed electronically.

## 2020-08-16 NOTE — Op Note (Signed)
[facility] Patient Name: Pamela Luna Procedure Date: 08/16/2020 7:23 AM MRN: [id number] Endoscopist: Jackquline Denmark , MD Age: 60 Referring MD:  Date of Birth: 10/26/1960 Gender: Female Account #: [id number] Procedure:                Colonoscopy Indications:              Screening in patient at increased risk: Lynch                            syndrome. Medicines:                Monitored Anesthesia Care Procedure:                Pre-Anesthesia Assessment:                           - Prior to the procedure, a History and Physical                            was performed, and patient medications and                            allergies were reviewed. The patient's tolerance of                            previous anesthesia was also reviewed. The risks                            and benefits of the procedure and the sedation                            options and risks were discussed with the patient.                            All questions were answered, and informed consent                            was obtained. Prior Anticoagulants: The patient has                            taken no previous anticoagulant or antiplatelet                            agents. ASA Grade Assessment: II - A patient with                            mild systemic disease. After reviewing the risks                            and benefits, the patient was deemed in                            satisfactory condition to undergo the procedure.  After obtaining informed consent, the colonoscope                            was passed under direct vision. Throughout the                            procedure, the patient's blood pressure, pulse, and                            oxygen saturations were monitored continuously. The                            Olympus PFC-H190DL (#[id number]) Colonoscope was                            introduced through the anus with the intention of                             advancing to the cecum. The scope was advanced to                            the transverse colon before the procedure was                            aborted. Medications were given. The colonoscopy                            was performed without difficulty. The patient                            tolerated the procedure well. The quality of the                            bowel preparation was unsatisfactory. Scope In: 8:17:40 AM Scope Out: 8:24:22 AM Total Procedure Duration: 0 hours 6 minutes 42 seconds  Findings:                 Very poor preparation. There was retained solid                            stool and vegetable material which will clog the                            suction channel of the scope. Despite of aggressive                            suctioning and aspiration, stool could not be                            cleared. The colon appeared grossly normal. Complications:            No immediate complications. Estimated Blood Loss:     Estimated blood loss: none. Impression:               - Preparation of  the colon was unsatisfactory.                           - No specimens collected. Recommendation:           - Patient has a contact number available for                            emergencies. The signs and symptoms of potential                            delayed complications were discussed with the                            patient. Return to normal activities tomorrow.                            Written discharge instructions were provided to the                            patient.                           - Resume previous diet.                           - Continue present medications.                           - Repeat colonoscopy in 4 weeks with 2-day prep                            (MiraLAX and Clenpiq) because the bowel preparation                            was poor.                           - The findings and recommendations were discussed                             with the patient's husband [name]. Jackquline Denmark, MD 08/16/2020 8:36:24 AM This report has been signed electronically.

## 2020-08-20 ENCOUNTER — Telehealth: Payer: Self-pay | Admitting: *Deleted

## 2020-08-20 ENCOUNTER — Telehealth: Payer: Self-pay

## 2020-08-20 NOTE — Telephone Encounter (Signed)
Called [phone] and left a message we tried to reach pt for a follow up call. maw

## 2020-08-20 NOTE — Telephone Encounter (Signed)
  Follow up Call-  Call back number 08/16/2020 04/26/2019  Post procedure Call Back phone  # [phone]  Permission to leave phone message Yes Yes  Some recent data might be hidden     Patient questions:  Do you have a fever, pain , or abdominal swelling? No. Pain Score  0 *  Have you tolerated food without any problems? Yes.    Have you been able to return to your normal activities? Yes.    Do you have any questions about your discharge instructions: Diet   No. Medications  No. Follow up visit  No.  Do you have questions or concerns about your Care? No.  Actions: * If pain score is 4 or above: No action needed, pain <4.  1. Have you developed a fever since your procedure? no  2.   Have you had an respiratory symptoms (SOB or cough) since your procedure? no  3.   Have you tested positive for COVID 19 since your procedure no  4.   Have you had any family members/close contacts diagnosed with the COVID 19 since your procedure?  no   If yes to any of these questions please route to Joylene John, RN and Joella Prince, RN

## 2020-08-25 ENCOUNTER — Encounter: Payer: Self-pay | Admitting: Gastroenterology

## 2020-09-12 ENCOUNTER — Ambulatory Visit (AMBULATORY_SURGERY_CENTER): Payer: BC Managed Care – PPO | Admitting: *Deleted

## 2020-09-12 ENCOUNTER — Telehealth: Payer: Self-pay | Admitting: *Deleted

## 2020-09-12 VITALS — Ht 67.0 in | Wt 180.0 lb

## 2020-09-12 DIAGNOSIS — Z1509 Genetic susceptibility to other malignant neoplasm: Secondary | ICD-10-CM

## 2020-09-12 DIAGNOSIS — Z86010 Personal history of colonic polyps: Secondary | ICD-10-CM

## 2020-09-12 MED ORDER — CLENPIQ 10-3.5-12 MG-GM -GM/160ML PO SOLN: 1.0000 | Freq: Once | ORAL | 0 refills | Status: AC

## 2020-09-12 NOTE — Telephone Encounter (Signed)
Dr Lyndel Safe, Completed PV with patient and reviewed 2 day prep instructions with her.  She is adamant that she does not think she needs a 2 day prep.  She states that she had a migraine and did not drink enough water last time with the sutabs.  She states that she has never had issues in the past with having a poor prep and she feels that she can complete the clenpiq and be sufficiently cleaned out.  She is very concerned about going even longer without eating because that makes her very nauseous.   She asked that we consult with you about this prior to her procedure.   Please advise.  Lanelle Bal

## 2020-09-12 NOTE — Telephone Encounter (Signed)
OK to use normal prep.  RG

## 2020-09-12 NOTE — Progress Notes (Signed)
No egg or soy allergy known to patient  No issues with past sedation with any surgeries or procedures No intubation problems in the past  No FH of Malignant Hyperthermia No diet pills per patient No home 02 use per patient  No blood thinners per patient   No A fib or A flutter  EMMI video to pt or via [place] 19 guidelines implemented in PV today with Pt and RN  Pt is fully vaccinated  for Covid  Pt denies loose or missing teeth, denies dentures, partials, dental implants, capped or bonded teeth  PV completed virtually   NO PA's for preps discussed with pt In PV today  Discussed with pt there will be an out-of-pocket cost for prep and that varies from $0 to 70 dollars   Due to the COVID-19 pandemic we are asking patients to follow certain guidelines.  Pt aware of COVID protocols and [facility] guidelines

## 2020-09-17 NOTE — Telephone Encounter (Signed)
Called patient. No answer, left a message with Dr.Gupta's recommendations.

## 2020-09-18 NOTE — Telephone Encounter (Signed)
Pt asking if she can do the 2 day prep- as directed before  instructed yes, ok to do the 2 day prep sd directed  She asked if she can do TUMS for GERD if necessary- instructed yes

## 2020-09-18 NOTE — Telephone Encounter (Signed)
Pt is requesting a call back from a nurse regarding her prep.

## 2020-09-20 ENCOUNTER — Ambulatory Visit (AMBULATORY_SURGERY_CENTER): Payer: BC Managed Care – PPO | Admitting: Gastroenterology

## 2020-09-20 ENCOUNTER — Other Ambulatory Visit: Payer: Self-pay

## 2020-09-20 ENCOUNTER — Encounter: Payer: Self-pay | Admitting: Gastroenterology

## 2020-09-20 VITALS — BP 121/66 | HR 63 | Temp 97.1°F | Resp 14 | Ht 67.0 in | Wt 180.0 lb

## 2020-09-20 DIAGNOSIS — D123 Benign neoplasm of transverse colon: Secondary | ICD-10-CM | POA: Diagnosis not present

## 2020-09-20 DIAGNOSIS — Z86010 Personal history of colonic polyps: Secondary | ICD-10-CM

## 2020-09-20 DIAGNOSIS — K623 Rectal prolapse: Secondary | ICD-10-CM | POA: Diagnosis not present

## 2020-09-20 DIAGNOSIS — Z1509 Genetic susceptibility to other malignant neoplasm: Secondary | ICD-10-CM | POA: Diagnosis not present

## 2020-09-20 DIAGNOSIS — K626 Ulcer of anus and rectum: Secondary | ICD-10-CM

## 2020-09-20 DIAGNOSIS — Z800 Family history of malignant neoplasm of digestive organs: Secondary | ICD-10-CM | POA: Diagnosis not present

## 2020-09-20 DIAGNOSIS — K635 Polyp of colon: Secondary | ICD-10-CM

## 2020-09-20 MED ORDER — SODIUM CHLORIDE 0.9 % IV SOLN: 500.0000 mL | Freq: Once | INTRAVENOUS | Status: DC

## 2020-09-20 NOTE — Progress Notes (Signed)
PT taken to PACU. Monitors in place. VSS. Report given to RN. 

## 2020-09-20 NOTE — Progress Notes (Signed)
Called to room to assist during endoscopic procedure.  Patient ID and intended procedure confirmed with present staff. Received instructions for my participation in the procedure from the performing physician.  

## 2020-09-20 NOTE — Patient Instructions (Signed)
Thank you for allowing [place] to care for you today!  Await pathology results, approximately 7-10 days.  Recommend colonoscopy in 1 year with a 2 day prep.  Take Miralax 17 g by mouth once daily in a beverage of choice.  Resume other medications today.  Resume normal daily activities tomorrow.    YOU HAD AN ENDOSCOPIC PROCEDURE TODAY AT [facility]:   Refer to the procedure report that was given to you for any specific questions about what was found during the examination.  If the procedure report does not answer your questions, please call your gastroenterologist to clarify.  If you requested that your care partner not be given the details of your procedure findings, then the procedure report has been included in a sealed envelope for you to review at your convenience later.  YOU SHOULD EXPECT: Some feelings of bloating in the abdomen. Passage of more gas than usual.  Walking can help get rid of the air that was put into your GI tract during the procedure and reduce the bloating. If you had a lower endoscopy (such as a colonoscopy or flexible sigmoidoscopy) you may notice spotting of blood in your stool or on the toilet paper. If you underwent a bowel prep for your procedure, you may not have a normal bowel movement for a few days.  Please Note:  You might notice some irritation and congestion in your nose or some drainage.  This is from the oxygen used during your procedure.  There is no need for concern and it should clear up in a day or so.  SYMPTOMS TO REPORT IMMEDIATELY:   Following lower endoscopy (colonoscopy or flexible sigmoidoscopy):  Excessive amounts of blood in the stool  Significant tenderness or worsening of abdominal pains  Swelling of the abdomen that is new, acute  Fever of 100F or higher   Following upper endoscopy (EGD)  Vomiting of blood or coffee ground material  New chest pain or pain under the shoulder blades  Painful or persistently difficult  swallowing  New shortness of breath  Fever of 100F or higher  Black, tarry-looking stools  For urgent or emergent issues, a gastroenterologist can be reached at any hour by calling [phone]. Do not use MyChart messaging for urgent concerns.    DIET:  We do recommend a small meal at first, but then you may proceed to your regular diet.  Drink plenty of fluids but you should avoid alcoholic beverages for 24 hours.  ACTIVITY:  You should plan to take it easy for the rest of today and you should NOT DRIVE or use heavy machinery until tomorrow (because of the sedation medicines used during the test).    FOLLOW UP: Our staff will call the number listed on your records 48-72 hours following your procedure to check on you and address any questions or concerns that you may have regarding the information given to you following your procedure. If we do not reach you, we will leave a message.  We will attempt to reach you two times.  During this call, we will ask if you have developed any symptoms of COVID 19. If you develop any symptoms (ie: fever, flu-like symptoms, shortness of breath, cough etc.) before then, please call [phone].  If you test positive for Covid 19 in the 2 weeks post procedure, please call and report this information to [place].    If any biopsies were taken you will be contacted by phone or by letter within  the next 1-3 weeks.  Please call us at [phone] if you have not heard about the biopsies in 3 weeks.    SIGNATURES/CONFIDENTIALITY: You and/or your care partner have signed paperwork which will be entered into your electronic medical record.  These signatures attest to the fact that that the information above on your After Visit Summary has been reviewed and is understood.  Full responsibility of the confidentiality of this discharge information lies with you and/or your care-partner.YOU HAD AN ENDOSCOPIC PROCEDURE TODAY AT [facility]:   Refer to the  procedure report that was given to you for any specific questions about what was found during the examination.  If the procedure report does not answer your questions, please call your gastroenterologist to clarify.  If you requested that your care partner not be given the details of your procedure findings, then the procedure report has been included in a sealed envelope for you to review at your convenience later.  YOU SHOULD EXPECT: Some feelings of bloating in the abdomen. Passage of more gas than usual.  Walking can help get rid of the air that was put into your GI tract during the procedure and reduce the bloating. If you had a lower endoscopy (such as a colonoscopy or flexible sigmoidoscopy) you may notice spotting of blood in your stool or on the toilet paper. If you underwent a bowel prep for your procedure, you may not have a normal bowel movement for a few days.  Please Note:  You might notice some irritation and congestion in your nose or some drainage.  This is from the oxygen used during your procedure.  There is no need for concern and it should clear up in a day or so.  SYMPTOMS TO REPORT IMMEDIATELY:     Following upper endoscopy (EGD)  Vomiting of blood or coffee ground material  New chest pain or pain under the shoulder blades  Painful or persistently difficult swallowing  New shortness of breath  Fever of 100F or higher  Black, tarry-looking stools  For urgent or emergent issues, a gastroenterologist can be reached at any hour by calling [phone]. Do not use MyChart messaging for urgent concerns.    DIET:  We do recommend a small meal at first, but then you may proceed to your regular diet.  Drink plenty of fluids but you should avoid alcoholic beverages for 24 hours.  ACTIVITY:  You should plan to take it easy for the rest of today and you should NOT DRIVE or use heavy machinery until tomorrow (because of the sedation medicines used during the test).    FOLLOW  UP: Our staff will call the number listed on your records 48-72 hours following your procedure to check on you and address any questions or concerns that you may have regarding the information given to you following your procedure. If we do not reach you, we will leave a message.  We will attempt to reach you two times.  During this call, we will ask if you have developed any symptoms of COVID 19. If you develop any symptoms (ie: fever, flu-like symptoms, shortness of breath, cough etc.) before then, please call [phone].  If you test positive for Covid 19 in the 2 weeks post procedure, please call and report this information to [place].    If any biopsies were taken you will be contacted by phone or by letter within the next 1-3 weeks.  Please call us at [phone] if  you have not heard about the biopsies in 3 weeks.    SIGNATURES/CONFIDENTIALITY: You and/or your care partner have signed paperwork which will be entered into your electronic medical record.  These signatures attest to the fact that that the information above on your After Visit Summary has been reviewed and is understood.  Full responsibility of the confidentiality of this discharge information lies with you and/or your care-partner.

## 2020-09-20 NOTE — Progress Notes (Signed)
VS-SB  Pt's states no medical or surgical changes since previsit or office visit.

## 2020-09-20 NOTE — Op Note (Signed)
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 FH colon ca (mom at age 33, stomach                            Ca MGM) Medicines:                Monitored Anesthesia Care Procedure:                Pre-Anesthesia Assessment:                           - Prior to the procedure, a History and Physical                            was performed, and patient medications and                            allergies were reviewed. The patient's tolerance of                            previous anesthesia was also reviewed. The risks                            and benefits of the procedure and the sedation                            options and risks were discussed with the patient.                            All questions were answered, and informed consent                            was obtained. Prior Anticoagulants: The patient has                            taken no previous anticoagulant or antiplatelet                            agents. ASA Grade Assessment: II - A patient with                            mild systemic disease. After reviewing the risks  and benefits, the patient was deemed in                            satisfactory condition to undergo the procedure.                           After obtaining informed consent, the colonoscope                            was passed under direct vision. Throughout the                            procedure, the patient's blood pressure, pulse, and                            oxygen saturations were monitored continuously. The                             Olympus PCF-H190DL ([id number]) Colonoscope was                            introduced through the anus and advanced to the the                            cecum, identified by appendiceal orifice and                            ileocecal valve. The colonoscopy was performed                            without difficulty. The patient tolerated the                            procedure well. The quality of the bowel                            preparation was adequate to identify polyps. There                            was some retained stool especially in the right                            side of the colon despite 2-day prep. Aggressive                            suctioning and aspiration was performed. Overall                            over 90 to 95% the colonic mucosa was visualized                            satisfactorily. The ileocecal valve, appendiceal  orifice, and rectum were photographed. Scope In: 8:53:28 AM Scope Out: 9:17:07 AM Scope Withdrawal Time: 0 hours 16 minutes 45 seconds  Total Procedure Duration: 0 hours 23 minutes 39 seconds  Findings:                 Two sessile polyps were found in the distal                            transverse colon. The polyps were 4 to 6 mm in                            size. These polyps were removed with a cold snare.                            Resection and retrieval were complete.                           A small 6 mm nonbleeding ulcer was noted in the                            rectum. Likely a stercoral ulcer. Biopsies were                            taken with a cold forceps for histology.                           Non-bleeding internal hemorrhoids were found during                            retroflexion. The hemorrhoids were small. The colon                            was highly redundant.                           The exam was otherwise without abnormality on                             direct and retroflexion views. Complications:            No immediate complications. Estimated Blood Loss:     Estimated blood loss: none. Impression:               - Two 4 to 6 mm polyps in the distal transverse                            colon, removed with a cold snare. Resected and                            retrieved.                           - Small rectal mucosal ulceration-likely a small  stercoral ulcer. Biopsied.                           - Non-bleeding internal hemorrhoids.                           - The examination was otherwise normal on direct                            and retroflexion views. Recommendation:           - Patient has a contact number available for                            emergencies. The signs and symptoms of potential                            delayed complications were discussed with the                            patient. Return to normal activities tomorrow.                            Written discharge instructions were provided to the                            patient.                           - Resume previous diet.                           - Continue present medications.                           - MiraLAX 17 g p.o. once a day.                           - Await pathology results.                           - Repeat colonoscopy in 1 year for screening                            purposes with 2-day prep.                           - The findings and recommendations were discussed                            with [name]. Jackquline Denmark, MD 09/20/2020 9:25:29 AM This report has been signed electronically.

## 2020-09-24 ENCOUNTER — Telehealth: Payer: Self-pay | Admitting: *Deleted

## 2020-09-24 NOTE — Telephone Encounter (Signed)
  Follow up Call-  Call back number 09/20/2020 08/16/2020 04/26/2019  Post procedure Call Back phone  # [phone]  Permission to leave phone message Yes Yes Yes  Some recent data might be hidden     Patient questions:  Do you have a fever, pain , or abdominal swelling? No. Pain Score  0 *  Have you tolerated food without any problems? Yes.    Have you been able to return to your normal activities? Yes.    Do you have any questions about your discharge instructions: Diet   No. Medications  No. Follow up visit  No.  Do you have questions or concerns about your Care? No.  Actions: * If pain score is 4 or above: No action needed, pain <4.  1. Have you developed a fever since your procedure? no  2.   Have you had an respiratory symptoms (SOB or cough) since your procedure? no  3.   Have you tested positive for COVID 19 since your procedure no  4.   Have you had any family members/close contacts diagnosed with the COVID 19 since your procedure?  no   If yes to any of these questions please route to Joylene John, RN and Joella Prince, RN

## 2020-09-26 ENCOUNTER — Encounter: Payer: Self-pay | Admitting: Gastroenterology

## 2021-07-11 ENCOUNTER — Other Ambulatory Visit: Payer: Self-pay | Admitting: Gastroenterology

## 2021-11-22 ENCOUNTER — Telehealth: Payer: Self-pay | Admitting: *Deleted

## 2021-11-22 NOTE — Telephone Encounter (Signed)
Patient called back and rescheduled PV. ?

## 2021-11-22 NOTE — Telephone Encounter (Signed)
Patient no show/answer PV appointment for today. I called patient x3, no answer, left a messages x2 for the patient to call us back today before 5 pm or the PV and procedure will be cancelled. ? ?

## 2021-11-27 ENCOUNTER — Ambulatory Visit (AMBULATORY_SURGERY_CENTER): Payer: Self-pay

## 2021-11-27 ENCOUNTER — Encounter: Payer: Self-pay | Admitting: Gastroenterology

## 2021-11-27 ENCOUNTER — Other Ambulatory Visit: Payer: Self-pay

## 2021-11-27 VITALS — Ht 67.0 in | Wt 177.0 lb

## 2021-11-27 DIAGNOSIS — Z860100 Personal history of colon polyps, unspecified: Secondary | ICD-10-CM

## 2021-11-27 MED ORDER — CLENPIQ 10-3.5-12 MG-GM -GM/160ML PO SOLN: 1.0000 | Freq: Once | ORAL | 0 refills | Status: AC

## 2021-11-27 NOTE — Progress Notes (Signed)
Denies allergies to eggs or soy products. Denies complication of anesthesia or sedation. Denies use of weight loss medication. Denies use of O2.   Emmi instructions given for colonoscopy.  

## 2021-12-09 ENCOUNTER — Telehealth: Payer: Self-pay | Admitting: Gastroenterology

## 2021-12-09 NOTE — Telephone Encounter (Signed)
Spoke with patient. Clenpiq is $160. I will call pharmacy once they open and try using clenpiq coupon. I will call the pt back, she is aware. ?

## 2021-12-09 NOTE — Telephone Encounter (Signed)
Inbound call from patient stating she went to pick up her prep and it was more expensive than what she thought it was going to be. Patient is seeking advice if there is an alternative prep she can take that is cheaper. Patient is schedule to have procedure on 5/19. Please advise.    ?

## 2021-12-09 NOTE — Telephone Encounter (Signed)
Clenpiq Coupon given to pharmacy, cost NO CHARGE! ? ?Patient notified. Clenpiq ready for pick up per pharmacy.  ?

## 2021-12-13 ENCOUNTER — Ambulatory Visit (AMBULATORY_SURGERY_CENTER): Payer: BC Managed Care – PPO | Admitting: Gastroenterology

## 2021-12-13 ENCOUNTER — Encounter: Payer: Self-pay | Admitting: Gastroenterology

## 2021-12-13 VITALS — BP 131/69 | HR 63 | Temp 96.4°F | Resp 15 | Ht 67.0 in | Wt 177.0 lb

## 2021-12-13 DIAGNOSIS — K649 Unspecified hemorrhoids: Secondary | ICD-10-CM

## 2021-12-13 DIAGNOSIS — Z86010 Personal history of colonic polyps: Secondary | ICD-10-CM

## 2021-12-13 DIAGNOSIS — Z1509 Genetic susceptibility to other malignant neoplasm: Secondary | ICD-10-CM

## 2021-12-13 DIAGNOSIS — D124 Benign neoplasm of descending colon: Secondary | ICD-10-CM

## 2021-12-13 DIAGNOSIS — Z800 Family history of malignant neoplasm of digestive organs: Secondary | ICD-10-CM | POA: Diagnosis not present

## 2021-12-13 DIAGNOSIS — K635 Polyp of colon: Secondary | ICD-10-CM

## 2021-12-13 MED ORDER — SODIUM CHLORIDE 0.9 % IV SOLN: 500.0000 mL | Freq: Once | INTRAVENOUS | Status: DC

## 2021-12-13 NOTE — Patient Instructions (Signed)
Information on polyps and hemorrhoids given to you today.  Await pathology results.  Resume previous diet and medications.  Repeat colonoscopy in 1 year duet to Lynch Syndrome.   YOU HAD AN ENDOSCOPIC PROCEDURE TODAY AT [facility]:   Refer to the procedure report that was given to you for any specific questions about what was found during the examination.  If the procedure report does not answer your questions, please call your gastroenterologist to clarify.  If you requested that your care partner not be given the details of your procedure findings, then the procedure report has been included in a sealed envelope for you to review at your convenience later.  YOU SHOULD EXPECT: Some feelings of bloating in the abdomen. Passage of more gas than usual.  Walking can help get rid of the air that was put into your GI tract during the procedure and reduce the bloating. If you had a lower endoscopy (such as a colonoscopy or flexible sigmoidoscopy) you may notice spotting of blood in your stool or on the toilet paper. If you underwent a bowel prep for your procedure, you may not have a normal bowel movement for a few days.  Please Note:  You might notice some irritation and congestion in your nose or some drainage.  This is from the oxygen used during your procedure.  There is no need for concern and it should clear up in a day or so.  SYMPTOMS TO REPORT IMMEDIATELY:  Following lower endoscopy (colonoscopy or flexible sigmoidoscopy):  Excessive amounts of blood in the stool  Significant tenderness or worsening of abdominal pains  Swelling of the abdomen that is new, acute  Fever of 100F or higher   For urgent or emergent issues, a gastroenterologist can be reached at any hour by calling [phone]. Do not use MyChart messaging for urgent concerns.    DIET:  We do recommend a small meal at first, but then you may proceed to your regular diet.  Drink plenty of fluids but you  should avoid alcoholic beverages for 24 hours.  ACTIVITY:  You should plan to take it easy for the rest of today and you should NOT DRIVE or use heavy machinery until tomorrow (because of the sedation medicines used during the test).    FOLLOW UP: Our staff will call the number listed on your records 48-72 hours following your procedure to check on you and address any questions or concerns that you may have regarding the information given to you following your procedure. If we do not reach you, we will leave a message.  We will attempt to reach you two times.  During this call, we will ask if you have developed any symptoms of COVID 19. If you develop any symptoms (ie: fever, flu-like symptoms, shortness of breath, cough etc.) before then, please call [phone].  If you test positive for Covid 19 in the 2 weeks post procedure, please call and report this information to [place].    If any biopsies were taken you will be contacted by phone or by letter within the next 1-3 weeks.  Please call us at [phone] if you have not heard about the biopsies in 3 weeks.    SIGNATURES/CONFIDENTIALITY: You and/or your care partner have signed paperwork which will be entered into your electronic medical record.  These signatures attest to the fact that that the information above on your After Visit Summary has been reviewed and is understood.  Full responsibility of  the confidentiality of this discharge information lies with you and/or your care-partner.

## 2021-12-13 NOTE — Progress Notes (Signed)
Called to room to assist during endoscopic procedure.  Patient ID and intended procedure confirmed with present staff. Received instructions for my participation in the procedure from the performing physician.  

## 2021-12-13 NOTE — Progress Notes (Signed)
Report given to PACU, vss 

## 2021-12-13 NOTE — Op Note (Signed)
[name] Procedure Date: 12/13/2021 7:35 AM MRN: [id number] Endoscopist: Jackquline Denmark , MD Age: 61 Referring MD:  Date of Birth: [birth year] Gender: Female Account #: [id number] Procedure:                Colonoscopy Indications:              Screening in patient at increased risk: Lynch                            syndrome related toPMS2 mutation heterozygous. FH                            colon ca (mom at age 10, stomach Ca MGM) Medicines:                Monitored Anesthesia Care Procedure:                Pre-Anesthesia Assessment:                           - Prior to the procedure, a History and Physical                            was performed, and patient medications and                            allergies were reviewed. The patient's tolerance of                            previous anesthesia was also reviewed. The risks                            and benefits of the procedure and the sedation                            options and risks were discussed with the patient.                            All questions were answered, and informed consent                            was obtained. Prior Anticoagulants: The patient has                            taken no previous anticoagulant or antiplatelet                            agents. ASA Grade Assessment: II - A patient with                            mild systemic disease. After reviewing the risks                            and benefits, the patient was deemed in  satisfactory condition to undergo the procedure.                           After obtaining informed consent, the colonoscope                            was passed under direct vision. Throughout the                            procedure, the patient's blood pressure, pulse, and                            oxygen saturations were monitored continuously. The                            PCF-HQ190L Colonoscope  was introduced through the                            anus and advanced to the the cecum, identified by                            appendiceal orifice and ileocecal valve. The                            colonoscopy was performed without difficulty. The                            patient tolerated the procedure well. The quality                            of the bowel preparation was good. The ileocecal                            valve, appendiceal orifice, and rectum were                            photographed. Scope In: 8:06:02 AM Scope Out: 8:24:23 AM Scope Withdrawal Time: 0 hours 12 minutes 26 seconds  Total Procedure Duration: 0 hours 18 minutes 21 seconds  Findings:                 Two sessile polyps were found in the mid descending                            colon. The polyps were 2 to 4 mm in size. These                            polyps were removed with a cold snare. Resection                            and retrieval were complete.                           Non-bleeding internal hemorrhoids were found during  retroflexion. The hemorrhoids were small and Grade                            I (internal hemorrhoids that do not prolapse).                           The exam was otherwise without abnormality on                            direct and retroflexion views. Complications:            No immediate complications. Estimated Blood Loss:     Estimated blood loss: none. Impression:               - Two 2 to 4 mm polyps in the mid descending colon,                            removed with a cold snare. Resected and retrieved.                           - Non-bleeding internal hemorrhoids.                           - The examination was otherwise normal on direct                            and retroflexion views. Recommendation:           - Patient has a contact number available for                            emergencies. The signs and symptoms of potential                             delayed complications were discussed with the                            patient. Return to normal activities tomorrow.                            Written discharge instructions were provided to the                            patient.                           - Resume previous diet.                           - Continue present medications.                           - Await pathology results.                           - Repeat colonoscopy in 1 year for screening  purposes d/t Lynch.                           - The findings and recommendations were discussed                            with the patient's family [name]) Jackquline Denmark, MD 12/13/2021 8:29:51 AM This report has been signed electronically.

## 2021-12-13 NOTE — Progress Notes (Signed)
Vitals-JK  Pt's states no medical or surgical changes since previsit or office visit.

## 2021-12-13 NOTE — Progress Notes (Signed)
Chief Complaint: New diagnosis for Lynch syndrome  Referring Provider:  Marco Collie, MD      ASSESSMENT AND PLAN;   #1. 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 FH colon ca (mom at age 62, stomach Ca MGM)  #2. GERD with small HH  Plan: -Proceed with colon    HPI:    Pamela Luna is a 61 y.o. female  Dx as having Lynch syndrome with positive PMS2 mutation. No significant GI complaints except for heartburn despite Nexium.  No odynophagia or dysphagia. Has mild constipation intermittently.  Better with increased water intake. Has started baby aspirin once a day.  No nausea, vomiting, odynophagia or dysphagia.  No significant diarrhea.  There is no melena or hematochezia. No unintentional weight loss.  Highly motivated.  Starting exercise like walking with niece.   Past GI procedures: -colon 03/2017 ([facility]): neg except hoids. -EGD12/2013: small HH, mild gastritis, neg SBx. Past Medical History:  Diagnosis Date   Allergy    Anxiety    Cataract    Constipation    FHx: migraine headaches    GERD (gastroesophageal reflux disease)    H/O seasonal allergies    Heart murmur    H/O    History of Helicobacter pylori infection    Hyperlipidemia    Hypertension    IBS (irritable bowel syndrome)    Osteopenia    Osteoporosis    Ovarian cyst     Past Surgical History:  Procedure Laterality Date   [place]   COLONOSCOPY  04/15/2017; 04/26/2019   Small internal hermorrhoids. Otherwise normal colonoscopy.    CYSTOSCOPY N/A 01/10/2019   Procedure: CYSTOSCOPY;  Surgeon: [name], MD;  Location: [facility];  Service: Gynecology;  Laterality: N/A;   ESOPHAGOGASTRODUODENOSCOPY  06/30/2012   Small hiatal hernia. Mild gastritis.    LAPAROSCOPIC VAGINAL HYSTERECTOMY WITH SALPINGO OOPHORECTOMY N/A 01/10/2019   Procedure: LAPAROSCOPIC ASSISTED VAGINAL HYSTERECTOMY WITH RIGHT SALPINGO OOPHORECTOMY;  Surgeon: [name], MD;   Location: [place];  Service: Gynecology;  Laterality: N/A;   LEFT OOPHORECTOMY  04/20/12   POLYPECTOMY     polyps 2020   SCALP CYST EXCISION  2006 - 2009   TONSILLECTOMY  1966   UPPER GASTROINTESTINAL ENDOSCOPY  04/26/2019    Family History  Problem Relation Age of Onset   Cancer Mother        History of Colon Cancer 2004 in her late 55's   Colon cancer Mother        80's   Diabetes Father    Arthritis Father    Heart disease Father    Colon polyps Sister    Cancer Maternal Uncle    Colon cancer Maternal Uncle        60's   Cancer Maternal Uncle    Cancer Maternal Grandmother    Stomach cancer Maternal Grandmother    Cancer Cousin    Cancer Other        grant grandmother had breast cancer    Esophageal cancer Neg Hx    Rectal cancer Neg Hx     Social History   Tobacco Use   Smoking status: Former    Packs/day: 0.25    Years: 16.00    Pack years: 4.00    Types: Cigarettes    Quit date: 12/15/2001    Years since quitting: 20.0   Smokeless tobacco: Never  Vaping Use   Vaping Use: Former   Devices: Tried them for  a few months. Said no nicotine  Substance Use Topics   Alcohol use: Yes    Comment: Rarely   Drug use: No    Current Outpatient Medications  Medication Sig Dispense Refill   aspirin 81 MG chewable tablet Chew 81 mg by mouth daily.     calcium carbonate (OSCAL) 1500 (600 Ca) MG TABS tablet Take 600 mg by mouth daily.      Cholecalciferol (VITAMIN D) 50 MCG (2000 UT) CAPS Take 2,000 Units by mouth daily.      gabapentin (NEURONTIN) 100 MG capsule TAKE 1 CAPSULE BY MOUTH ONCE DAILY AT BEDTIME FOR 30 DAYS     Ibuprofen 200 MG CAPS Take by mouth.     lisinopril-hydrochlorothiazide (ZESTORETIC) 20-12.5 MG tablet Take 1 tablet by mouth 2 (two) times daily.     Multiple Vitamins-Minerals (MULTIVITAMIN WITH MINERALS) tablet Take 1 tablet by mouth daily.     pantoprazole (PROTONIX) 40 MG tablet Take 1 tablet by mouth once daily 90 tablet 4    polyethylene glycol (MIRALAX / GLYCOLAX) 17 g packet Take 17 g by mouth daily.     pravastatin (PRAVACHOL) 10 MG tablet Take 10 mg by mouth 2 (two) times a week. Wed and Sat     venlafaxine XR (EFFEXOR-XR) 150 MG 24 hr capsule Take 150 mg by mouth daily.     verapamil (VERELAN PM) 180 MG 24 hr capsule Take 180 mg by mouth at bedtime.     denosumab (PROLIA) 60 MG/ML SOSY injection Inject 60 mg into the skin every 6 (six) months. Last shot was 12/2018     hydrOXYzine (ATARAX/VISTARIL) 25 MG tablet Take 25 mg by mouth 3 (three) times daily as needed for anxiety.     Current Facility-Administered Medications  Medication Dose Route Frequency Provider Last Rate Last Admin   0.9 %  sodium chloride infusion  500 mL Intravenous Once Jackquline Denmark, MD        No Known Allergies  Review of Systems:  Constitutional: Denies fever, chills, diaphoresis, appetite change and fatigue.  HEENT: Denies photophobia, eye pain, redness, hearing loss, ear pain, congestion, sore throat, rhinorrhea, sneezing, mouth sores, neck pain, neck stiffness and tinnitus.   Respiratory: Denies SOB, DOE, cough, chest tightness,  and wheezing.   Cardiovascular: Denies chest pain, palpitations and leg swelling.  Genitourinary: Denies dysuria, urgency, frequency, hematuria, flank pain and difficulty urinating.  Musculoskeletal: Denies myalgias, back pain, joint swelling, arthralgias and gait problem.  Skin: No rash.  Neurological: Denies dizziness, seizures, syncope, weakness, light-headedness, numbness and headaches.  Hematological: Denies adenopathy. Easy bruising, personal or family bleeding history  Psychiatric/Behavioral: No anxiety or depression     Physical Exam:    BP (!) 130/44   Pulse 68   Temp (!) 96.4 F (35.8 C) (Temporal)   Resp 16   Ht 5' 7"  (1.702 m)   Wt 177 lb (80.3 kg)   LMP 08/16/2016   SpO2 96%   BMI 27.72 kg/m  Filed Weights   12/13/21 0714  Weight: 177 lb (80.3 kg)   Constitutional:   Well-developed, in no acute distress. Psychiatric: Normal mood and affect. Behavior is normal. Tele visit  Data Reviewed: I have personally reviewed following labs and imaging studies  CBC:    Latest Ref Rng & Units 01/11/2019    5:50 AM 01/03/2019    8:08 AM 04/16/2012   10:55 AM  CBC  WBC 4.0 - 10.5 K/uL 12.2   5.6   5.5  Hemoglobin 12.0 - 15.0 g/dL 11.3   13.4   14.1    Hematocrit 36.0 - 46.0 % 34.9   41.4   41.0    Platelets 150 - 400 K/uL 198   211   220      CMP:    Latest Ref Rng & Units 01/03/2019    8:08 AM 04/16/2012   10:55 AM 03/14/2012    3:46 PM  CMP  Glucose 70 - 99 mg/dL 113   89   103    BUN 6 - 20 mg/dL 17   13   7     Creatinine 0.44 - 1.00 mg/dL 0.65   0.62   0.73    Sodium 135 - 145 mmol/L 139   138   137    Potassium 3.5 - 5.1 mmol/L 4.4   4.4   4.9    Chloride 98 - 111 mmol/L 102   100   97    CO2 22 - 32 mmol/L 30   29   32    Calcium 8.9 - 10.3 mg/dL 9.4   9.5   10.2    Total Protein 6.5 - 8.1 g/dL 7.4   7.4   7.3    Total Bilirubin 0.3 - 1.2 mg/dL 0.1   0.3   0.6    Alkaline Phos 38 - 126 U/L 51   52   57    AST 15 - 41 U/L 21   20   16     ALT 0 - 44 U/L 17   11   12     This service was provided via telemedicine.  The patient was located at home.  The provider was located in office.  The patient did consent to this telephone visit and is aware of possible charges through their insurance for this visit.  The patient was referred by Dr. Nyra Capes.   Time spent on call/coordination of care: 25 min     Carmell Austria, MD 12/13/2021, 8:02 AM  Cc: Marco Collie, MD

## 2021-12-16 ENCOUNTER — Telehealth: Payer: Self-pay | Admitting: *Deleted

## 2021-12-16 NOTE — Telephone Encounter (Signed)
  Follow up Call-     12/13/2021    7:14 AM 09/20/2020    7:39 AM 08/16/2020    7:12 AM 04/26/2019   12:53 PM  Call back number  Post procedure Call Back phone  # [phone]  Permission to leave phone message Yes Yes Yes Yes    First attempt for follow up phone call. No answer at number given.  Left message on voicemail.

## 2021-12-16 NOTE — Telephone Encounter (Signed)
  Follow up Call-     12/13/2021    7:14 AM 09/20/2020    7:39 AM 08/16/2020    7:12 AM 04/26/2019   12:53 PM  Call back number  Post procedure Call Back phone  # [phone]  Permission to leave phone message Yes Yes Yes Yes     Patient questions:  Do you have a fever, pain , or abdominal swelling? No. Pain Score  0 *  Have you tolerated food without any problems? Yes.    Have you been able to return to your normal activities? Yes.    Do you have any questions about your discharge instructions: Diet   No. Medications  No. Follow up visit  No.  Do you have questions or concerns about your Care? No.  Actions: * If pain score is 4 or above: No action needed, pain <4.

## 2021-12-17 ENCOUNTER — Encounter: Payer: Self-pay | Admitting: Gastroenterology

## 2022-04-07 ENCOUNTER — Other Ambulatory Visit: Payer: Self-pay | Admitting: Gastroenterology

## 2022-06-30 ENCOUNTER — Other Ambulatory Visit: Payer: Self-pay | Admitting: Gastroenterology

## 2022-07-10 ENCOUNTER — Other Ambulatory Visit: Payer: Self-pay

## 2022-07-10 MED ORDER — PANTOPRAZOLE SODIUM 40 MG PO TBEC: 40.0000 mg | DELAYED_RELEASE_TABLET | Freq: Every day | ORAL | 2 refills | Status: DC

## 2022-12-11 ENCOUNTER — Other Ambulatory Visit: Payer: Self-pay | Admitting: Obstetrics and Gynecology

## 2022-12-11 DIAGNOSIS — E041 Nontoxic single thyroid nodule: Secondary | ICD-10-CM

## 2022-12-11 DIAGNOSIS — Z8249 Family history of ischemic heart disease and other diseases of the circulatory system: Secondary | ICD-10-CM

## 2022-12-24 ENCOUNTER — Ambulatory Visit
Admission: RE | Admit: 2022-12-24 | Discharge: 2022-12-24 | Disposition: A | Discharge status: Home / Self Care | Payer: BC Managed Care – PPO | Source: Ambulatory Visit | Attending: Obstetrics and Gynecology | Admitting: Obstetrics and Gynecology

## 2022-12-24 DIAGNOSIS — E041 Nontoxic single thyroid nodule: Secondary | ICD-10-CM

## 2023-01-20 ENCOUNTER — Ambulatory Visit
Admission: RE | Admit: 2023-01-20 | Discharge: 2023-01-20 | Disposition: A | Discharge status: Home / Self Care | Payer: BC Managed Care – PPO | Source: Ambulatory Visit | Attending: Obstetrics and Gynecology | Admitting: Obstetrics and Gynecology

## 2023-01-20 DIAGNOSIS — Z8249 Family history of ischemic heart disease and other diseases of the circulatory system: Secondary | ICD-10-CM

## 2023-03-31 ENCOUNTER — Other Ambulatory Visit: Payer: Self-pay | Admitting: Gastroenterology

## 2023-06-30 ENCOUNTER — Other Ambulatory Visit: Payer: Self-pay | Admitting: Gastroenterology

## 2023-07-18 ENCOUNTER — Other Ambulatory Visit: Payer: Self-pay | Admitting: Gastroenterology

## 2023-11-24 ENCOUNTER — Encounter: Payer: Self-pay | Admitting: Gastroenterology

## 2023-11-27 ENCOUNTER — Telehealth: Payer: Self-pay | Admitting: Gastroenterology

## 2023-11-27 NOTE — Telephone Encounter (Signed)
 Returned patient call & advised her that I reviewed her chart and did not see any diagnosis of diverticulitis mentioned, however if it was diagnosed outside of [facility] those records could not be viewed. She did ask about adding on an EGD d/t ongoing nausea/back pain. Advised her she would need an OV to discuss adding this on prior to her colon in June. She said she would call back at a later time since she is with PCP now & potentially doing a scan.

## 2023-11-27 NOTE — Telephone Encounter (Signed)
 Patient called and stated that she was visiting her PCP and was wondering if she was ever diagnosed with Diverticulitis. Patient is just wanting to know so she can inform her PCP. Patient is requesting a call back. Please advise.

## 2023-12-08 ENCOUNTER — Other Ambulatory Visit (HOSPITAL_BASED_OUTPATIENT_CLINIC_OR_DEPARTMENT_OTHER): Payer: Self-pay | Admitting: Family Medicine

## 2023-12-08 DIAGNOSIS — R1032 Left lower quadrant pain: Secondary | ICD-10-CM

## 2023-12-09 ENCOUNTER — Ambulatory Visit (INDEPENDENT_AMBULATORY_CARE_PROVIDER_SITE_OTHER)
Admission: RE | Admit: 2023-12-09 | Discharge: 2023-12-09 | Disposition: A | Discharge status: Home / Self Care | Payer: Self-pay | Source: Ambulatory Visit | Attending: Family Medicine | Admitting: Family Medicine

## 2023-12-09 ENCOUNTER — Telehealth: Payer: Self-pay

## 2023-12-09 DIAGNOSIS — R1032 Left lower quadrant pain: Secondary | ICD-10-CM | POA: Diagnosis not present

## 2023-12-09 MED ORDER — IOHEXOL 300 MG/ML  SOLN
100.0000 mL | Freq: Once | INTRAMUSCULAR | Status: AC | PRN
  Administered 2023-12-09: 100 mL via INTRAVENOUS

## 2023-12-09 NOTE — Telephone Encounter (Signed)
 Dr Lonie Roa called- LLQ pain x 2 weeks, getting CT today. US  neg. Had abdominal wall hematoma after roller coaster ride. Was given empiric antibiotics for presumed diverticulitis, then had diarrhea. Last colonoscopy did not show any diverticulosis. Peterson Brandt, can we work her into [facility] clinic tomorrow or day after.   Left message for patient to return call to further discuss an appointment.  Advised that patient should return call to be scheduled.  Will work patient in on [facility]'s schedule for 12-11-23 at 8:40am, if patient is willing.  Will continue efforts.

## 2023-12-10 NOTE — Progress Notes (Unsigned)
 12/11/2023 MYLISHA SANANTONIO [id number] 1961-05-03  Referring provider: Lonie Roa, MD Primary GI doctor: Dr. 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 pylori gastritis negative for metaplasia S/p hysterectomy , follows with dermatology 12/09/2023 LLQ pain unremarkable Schedule for colonoscopy 05/25 with Dr. Venice Gillis, We have discussed the risks of bleeding, infection, perforation, medication reactions, and remote risk of death associated with colonoscopy. All questions were answered and the patient acknowledges these risk and wishes to proceed. Discussed adding on EGD but no GERD, no upper AB pain, prefers to wait with her insurance  Chills, lower back pain, nausea and LLQ AB pain acutely, mainly constipation but had diarrhea last 2 days Did not do well with zofran , did well with bentyl generally has constipation has been having diarrhea since mother's day, was given cipro/flagyl had finished 7 day course before CT scan Had COVID late march/early April, on fiber, water  12/09/2023 CT abdomen pelvis lower abdominal pain no acute findings, liver a few cysts gallbladder normal spleen normal pancreas normal status post hysterectomy Reassuring CT, urine normal, negative stools studies, had negative labs with PCP Likely from worsening constipation due to COVID/not moving as much, has constipation prior to pain that has improved with increased BM, no diarrhea today and she is overall improving - Increase fiber/ water  intake, decrease caffeine, increase activity level. -consider adding on miralax - consider cdiff testing IF she continues to have stools but none today - continue with colonoscopy -  given IBS information, continue bentyl, IBGARD  GERD no dysphagia On pantoprazole , occ tums States she has different insurance, wants to wait on EGD due to this Will get CT to mention stomach, this was not addressed  Cardiac calcium score 01/20/2023  Patient Care Team: Lonie Roa, MD as PCP - General (Family Medicine) Lajuan Pila, MD as Consulting Physician (Gastroenterology) Merryl Abraham, MD as Consulting Physician (Obstetrics and Gynecology)  HISTORY OF PRESENT ILLNESS: 63 y.o. female with a past medical history listed below presents for evaluation of AB pain, nausea, constipation.   Discussed the use of AI scribe software for clinical note transcription with the patient, who gave verbal consent to proceed.  History of Present Illness   KHAMONI LEVENE is a 63 year old female with Lynch syndrome who presents with abdominal discomfort and nausea.  She experiences abdominal discomfort characterized by warmth, burning, and cramping, which began with chills, lower back pain, and body aches, primarily in the lower back. Nausea was acute for a couple of days but has since improved, though she still experiences mouth watering as if she could vomit. Her appetite has been off, and she has felt bloated, noting that one side of her abdomen is more raised than the other.  She has a history of constipation but has experienced diarrhea or very loose stools since Mother's Day, which she attributes to a recent course of antibiotics and a CT scan with contrast. She completed a seven-day course of antibiotics, including Cipro and Flagyl. She typically manages constipation with a daily fiber pill, adequate water  intake, and exercise.  She has a history of Lynch syndrome with a family history of colon cancer in her mother and stomach cancer in her maternal grandmother. She undergoes regular cancer  surveillance screenings, including a colonoscopy and endoscopy. Her last colonoscopy on Dec 13, 2021,  revealed two small polyps and hemorrhoids. Her last endoscopy in January 2022 showed a hiatal hernia and stomach inflammation, but was negative for H. pylori and metaplasia.  She recently had an abdominal CT scan, which showed a few cysts on her liver but was otherwise unremarkable. A stool sample was taken due to her abdominal discomfort and nausea, which returned normal results. She also had a urinalysis, which was normal.  She experienced a bruise on her abdomen after riding a roller coaster on April 19, which she describes as 'spread over this whole area where the bar came down.' She has a history of heartburn, for which she takes pantoprazole , and reports no trouble swallowing.  She had COVID-19 for the first time in late March to early April, which she suspects may have contributed to her symptoms. She was exercising 150 minutes a week before COVID-19. She has been using dicyclomine for abdominal cramping, which she finds helpful.        She  reports that she quit smoking about 22 years ago. Her smoking use included cigarettes. She started smoking about 38 years ago. She has a 4 pack-year smoking history. She has never used smokeless tobacco. She reports current alcohol use. She reports that she does not use drugs.  RELEVANT GI HISTORY, IMAGING AND LABS: Results   LABS Stool GI pathogen panel: Negative  RADIOLOGY Abdominal CT scan: Few cysts on liver, otherwise unremarkable (12/09/2023) Cardiac calcium score: Normal (2024)  DIAGNOSTIC Colonoscopy: Two polyps, 2-4 mm, hemorrhoids (12/13/2021) Endoscopy: Hiatal hernia, gastritis, negative H. pylori, negative for metaplasia (07/2020)      CBC    Component Value Date/Time   WBC 12.2 (H) 01/11/2019 0550   RBC 3.82 (L) 01/11/2019 0550   HGB 11.3 (L) 01/11/2019 0550   HCT 34.9 (L) 01/11/2019 0550   PLT 198 01/11/2019 0550   MCV 91.4 01/11/2019 0550   MCH 29.6 01/11/2019 0550   MCHC 32.4 01/11/2019 0550   RDW 14.4 01/11/2019 0550    LYMPHSABS 1.4 04/16/2012 1055   MONOABS 0.3 04/16/2012 1055   EOSABS 0.0 04/16/2012 1055   BASOSABS 0.0 04/16/2012 1055   No results for input(s): "HGB" in the last 8760 hours.  CMP     Component Value Date/Time   NA 139 01/03/2019 0808   K 4.4 01/03/2019 0808   CL 102 01/03/2019 0808   CO2 30 01/03/2019 0808   GLUCOSE 113 (H) 01/03/2019 0808   BUN 17 01/03/2019 0808   CREATININE 0.65 01/03/2019 0808   CALCIUM 9.4 01/03/2019 0808   PROT 7.4 01/03/2019 0808   ALBUMIN 4.0 01/03/2019 0808   AST 21 01/03/2019 0808   ALT 17 01/03/2019 0808   ALKPHOS 51 01/03/2019 0808   BILITOT 0.1 (L) 01/03/2019 0808   GFRNONAA >60 01/03/2019 0808   GFRAA >60 01/03/2019 0808      Latest Ref Rng & Units 01/03/2019    8:08 AM 04/16/2012   10:55 AM 03/14/2012    3:46 PM  Hepatic Function  Total Protein 6.5 - 8.1 g/dL 7.4  7.4  7.3   Albumin 3.5 - 5.0 g/dL 4.0  4.0  4.2   AST 15 - 41 U/L 21  20  16    ALT 0 - 44 U/L 17  11  12    Alk Phosphatase 38 - 126 U/L 51  52  57   Total Bilirubin 0.3 - 1.2 mg/dL 0.1  0.3  0.6       Current Medications:   Current Outpatient Medications (Endocrine & Metabolic):    denosumab (PROLIA) 60 MG/ML SOSY injection, Inject 60 mg into the skin every 6 (six) months. Last shot was 12/2018  Current Outpatient Medications (Cardiovascular):    lisinopril -hydrochlorothiazide  (ZESTORETIC) 20-12.5 MG tablet, Take 1 tablet by mouth 2 (two) times daily.   pravastatin (PRAVACHOL) 10 MG tablet, Take 10 mg by mouth 2 (two) times a week. Wed and Sat   verapamil  (VERELAN  PM) 180 MG 24 hr capsule, Take 180 mg by mouth at bedtime.   Current Outpatient Medications (Analgesics):    Ibuprofen  200 MG CAPS, Take by mouth.   Current Outpatient Medications (Other):    calcium carbonate (OSCAL) 1500 (600 Ca) MG TABS tablet, Take 600 mg by mouth daily.    gabapentin (NEURONTIN) 100 MG capsule, TAKE 1 CAPSULE BY MOUTH ONCE DAILY AT BEDTIME FOR 30 DAYS   Glucosamine 500 MG CAPS, Take  by mouth.   hydrOXYzine (ATARAX/VISTARIL) 25 MG tablet, Take 25 mg by mouth 3 (three) times daily as needed for anxiety.   Multiple Vitamins-Minerals (MULTIVITAMIN WITH MINERALS) tablet, Take 1 tablet by mouth daily.   Na Sulfate-K Sulfate-Mg Sulfate concentrate (SUPREP) 17.5-3.13-1.6 GM/177ML SOLN, Take 1 kit (354 mLs total) by mouth once for 1 dose.   pantoprazole  (PROTONIX ) 40 MG tablet, Take 1 tablet by mouth once daily   polyethylene glycol (MIRALAX / GLYCOLAX) 17 g packet, Take 17 g by mouth daily.   venlafaxine  XR (EFFEXOR -XR) 150 MG 24 hr capsule, Take 150 mg by mouth daily.  Medical History:  Past Medical History:  Diagnosis Date   Allergy    Anxiety    Cataract    Constipation    FHx: migraine headaches    GERD (gastroesophageal reflux disease)    H/O seasonal allergies    Heart murmur    H/O    History of Helicobacter pylori infection    Hyperlipidemia    Hypertension    IBS (irritable bowel syndrome)    Osteopenia    Osteoporosis    Ovarian cyst    Allergies: No Known Allergies   Surgical History:  She  has a past surgical history that includes Tonsillectomy (1966); Cesarean section (1998); SCALP CYST EXCISION (2006 - 2009); Left oophorectomy (04/20/12); Laparoscopic vaginal hysterectomy with salpingo oophorectomy (N/A, 01/10/2019); Cystoscopy (N/A, 01/10/2019); Esophagogastroduodenoscopy (06/30/2012); Colonoscopy (04/15/2017; 04/26/2019); Polypectomy; and Upper gastrointestinal endoscopy (04/26/2019). Family History:  Her family history includes Arthritis in her father; Cancer in her cousin, maternal grandmother, maternal uncle, maternal uncle, mother, and another family member; Colon cancer in her maternal uncle and mother; Colon polyps in her sister; Diabetes in her father; Heart disease in her father; Stomach cancer in her maternal grandmother.  REVIEW OF SYSTEMS  : All other systems reviewed and negative except where noted in the History of Present Illness.  PHYSICAL  EXAM: BP 110/68   Ht 5\' 7"  (1.702 m)   Wt 173 lb (78.5 kg)   LMP 08/16/2016   BMI 27.10 kg/m  Physical Exam   GENERAL APPEARANCE: Well nourished, in no apparent distress. HEENT: No cervical lymphadenopathy, unremarkable thyroid , sclerae anicteric, conjunctiva pink. RESPIRATORY: Respiratory effort normal, breath sounds equal bilaterally without rales, rhonchi, or wheezing. CARDIO: Regular rate and rhythm with no murmurs, rubs, or gallops, peripheral pulses intact. ABDOMEN: Soft, non-distended, active bowel sounds in all four quadrants, tender to palpation, no rebound, no mass appreciated, no costovertebral angle tenderness. RECTAL: Declines. MUSCULOSKELETAL: Full  range of motion, normal gait, without edema. SKIN: Dry, intact without rashes or lesions. No jaundice. NEURO: Alert, oriented, no focal deficits. PSYCH: Cooperative, normal mood and affect.      Edmonia Gottron, PA-C 11:25 AM

## 2023-12-11 ENCOUNTER — Encounter: Payer: Self-pay | Admitting: Physician Assistant

## 2023-12-11 ENCOUNTER — Other Ambulatory Visit

## 2023-12-11 ENCOUNTER — Ambulatory Visit (INDEPENDENT_AMBULATORY_CARE_PROVIDER_SITE_OTHER): Admitting: Physician Assistant

## 2023-12-11 VITALS — BP 110/68 | Ht 67.0 in | Wt 173.0 lb

## 2023-12-11 DIAGNOSIS — K649 Unspecified hemorrhoids: Secondary | ICD-10-CM | POA: Diagnosis not present

## 2023-12-11 DIAGNOSIS — D124 Benign neoplasm of descending colon: Secondary | ICD-10-CM

## 2023-12-11 DIAGNOSIS — R110 Nausea: Secondary | ICD-10-CM

## 2023-12-11 DIAGNOSIS — Z860100 Personal history of colon polyps, unspecified: Secondary | ICD-10-CM

## 2023-12-11 DIAGNOSIS — Z800 Family history of malignant neoplasm of digestive organs: Secondary | ICD-10-CM

## 2023-12-11 DIAGNOSIS — K219 Gastro-esophageal reflux disease without esophagitis: Secondary | ICD-10-CM

## 2023-12-11 DIAGNOSIS — R197 Diarrhea, unspecified: Secondary | ICD-10-CM

## 2023-12-11 DIAGNOSIS — Z1509 Genetic susceptibility to other malignant neoplasm: Secondary | ICD-10-CM

## 2023-12-11 DIAGNOSIS — R1032 Left lower quadrant pain: Secondary | ICD-10-CM | POA: Diagnosis not present

## 2023-12-11 DIAGNOSIS — K5900 Constipation, unspecified: Secondary | ICD-10-CM

## 2023-12-11 MED ORDER — NA SULFATE-K SULFATE-MG SULF 17.5-3.13-1.6 GM/177ML PO SOLN: 1.0000 | Freq: Once | ORAL | 0 refills | Status: AC

## 2023-12-11 NOTE — Patient Instructions (Addendum)
 Your provider has requested that you go to the basement level for lab work before leaving today. Press "B" on the elevator. The lab is located at the first door on the left as you exit the elevator.  First do a trial off milk/lactose products if you use them.  Add fiber like benefiber or citracel once a day Increase activity Can do trial of IBGard which is over the counter for AB pain- Take 1-2 capsules once a day for maintence or twice a day during a flare Can send in an anti spasm medication, Bentyl, to take as needed Please try to decrease stress. consider talking with PCP about anti anxiety medication or try head space app for meditation. if any worsening symptoms like blood in stool, weight loss, please call the office   Miralax is an osmotic laxative.  It only brings more water  into the stool.  This is safe to take daily.  Can take up to 17 gram of miralax twice a day.  Mix with juice or coffee.  Start 1 capful at night for 3-4 days and reassess your response in 3-4 days.  You can increase and decrease the dose based on your response.  Remember, it can take up to 3-4 days to take effect OR for the effects to wear off.   I often pair this with benefiber in the morning to help assure the stool is not too loose.      FODMAP stands for fermentable oligo-, di-, mono-saccharides and polyols (1). These are the scientific terms used to classify groups of carbs that are difficult for our body to digest and that are notorious for triggering digestive symptoms like bloating, gas, loose stools and stomach pain.   You can try low FODMAP diet  - start with eliminating just one column at a time that you feel may be a trigger for you. - the table at the very bottom contains foods that are low in FODMAPs   Sometimes trying to eliminate the FODMAP's from your diet is difficult or tricky, if you are stuggling with trying to do the elimination diet you can try an enzyme.  There is a food enzymes  that you sprinkle in or on your food that helps break down the FODMAP. You can read more about the enzyme by going to this site: https://fodzyme.com/   You have been scheduled for a colonoscopy. Please follow written instructions given to you at your visit today.   If you use inhalers (even only as needed), please bring them with you on the day of your procedure.  DO NOT TAKE 7 DAYS PRIOR TO TEST- Trulicity (dulaglutide) Ozempic, Wegovy (semaglutide) Mounjaro (tirzepatide) Bydureon Bcise (exanatide extended release)  DO NOT TAKE 1 DAY PRIOR TO YOUR TEST Rybelsus (semaglutide) Adlyxin (lixisenatide) Victoza (liraglutide) Byetta (exanatide) ___________________________________________________________________________    Due to recent changes in healthcare laws, you may see the results of your imaging and laboratory studies on MyChart before your provider has had a chance to review them.  We understand that in some cases there may be results that are confusing or concerning to you. Not all laboratory results come back in the same time frame and the provider may be waiting for multiple results in order to interpret others.  Please give us  48 hours in order for your provider to thoroughly review all the results before contacting the office for clarification of your results.

## 2023-12-16 ENCOUNTER — Telehealth: Payer: Self-pay | Admitting: Physician Assistant

## 2023-12-16 NOTE — Telephone Encounter (Signed)
 Patient requesting call back to possible schedule egd. Scheduled for colon procedure 6/25. Please advise.

## 2023-12-16 NOTE — Telephone Encounter (Signed)
 PT is calling to request that the Bentyl be called in for her as discussed on 5/16. It should be sent to [facility] in [place]

## 2023-12-17 NOTE — Telephone Encounter (Signed)
 Patient scheduled for double procedure late July .Please advise on new prep instructions.

## 2023-12-17 NOTE — Telephone Encounter (Signed)
 Patient stated that she would like to know the status on her request for the Bentyl. Patient is requesting a call back informing her that they medication has been sent over. Please advise.

## 2023-12-18 ENCOUNTER — Other Ambulatory Visit: Payer: Self-pay

## 2023-12-18 NOTE — Telephone Encounter (Signed)
 This seems to be a Triage Davida. Will forward to POC C Nurse

## 2023-12-18 NOTE — Telephone Encounter (Signed)
 Inbound call from patient stating she forgot to mention her PCP prescribed dicyclomine 20 mg. States she no longer needs Bentyl prescription. Please advise, thank you.

## 2023-12-18 NOTE — Telephone Encounter (Signed)
 Looks like the pt just saw Cornelius on 5/16.

## 2023-12-18 NOTE — Telephone Encounter (Signed)
 Pt made aware of PA recommendations. No longer needs bentyl prescription.

## 2023-12-18 NOTE — Telephone Encounter (Signed)
 Returned patient call & she stated she's been having nausea, bloody mucous, back pain, and dizziness. Recently seen for OV on 5/16 with [facility]. Last BM was a few days ago, the bloody mucous happens w/o bm 2-3/day. She's been taking miralax daily & a fiber supplement, IBGard 2 capsules BID. She is scheduled for endo in July with Dr. Venice Gillis. She's requesting bentyl be sent in. Reviewed chart, but do not see where it was sent in at time of OV.

## 2023-12-18 NOTE — Telephone Encounter (Signed)
 Inbound call from patient requesting call back from nurse. Patient states she has developed new symptoms. Also states yesterday she started having a bloody mucus discharge. Please advise.   Thank you

## 2023-12-19 NOTE — Progress Notes (Signed)
 Agree with assessment/plan.  Edman Circle, MD [facility] [phone]

## 2023-12-22 ENCOUNTER — Telehealth: Payer: Self-pay | Admitting: Physician Assistant

## 2023-12-22 ENCOUNTER — Encounter: Payer: Self-pay | Admitting: Internal Medicine

## 2023-12-22 ENCOUNTER — Ambulatory Visit (INDEPENDENT_AMBULATORY_CARE_PROVIDER_SITE_OTHER)
Admission: RE | Admit: 2023-12-22 | Discharge: 2023-12-22 | Disposition: A | Discharge status: Home / Self Care | Source: Ambulatory Visit | Attending: Physician Assistant | Admitting: Physician Assistant

## 2023-12-22 DIAGNOSIS — R1032 Left lower quadrant pain: Secondary | ICD-10-CM

## 2023-12-22 DIAGNOSIS — K649 Unspecified hemorrhoids: Secondary | ICD-10-CM

## 2023-12-22 DIAGNOSIS — D124 Benign neoplasm of descending colon: Secondary | ICD-10-CM

## 2023-12-22 DIAGNOSIS — Z1509 Genetic susceptibility to other malignant neoplasm: Secondary | ICD-10-CM

## 2023-12-22 NOTE — Telephone Encounter (Signed)
 Inbound call from patient stating she is having urgent symptoms. States she is unable to have a bowel movement. States she is losing weight but her abdomen continues to be swollen. States she is still having nausea and abdominal pain. States she saw blood and mucous on Friday. Patient is requesting to change appointments with her friend who is scheduled for a colonoscopy on 6/19 to be able to have procedure sooner. Patient is requesting a urgent call back. Please advise, thank you.

## 2023-12-22 NOTE — Telephone Encounter (Signed)
 Patient called in last week with same symptoms. Dr. Rosaline Coma has availability for a double on 12/28/23, ok to reschedule?

## 2023-12-22 NOTE — Telephone Encounter (Signed)
 Called and spoke with patient regarding recommendations. Patient will come by the office today for x-ray and to pick up new prep instructions. EGD/colon has been rescheduled to 12/28/23 at 10 am with Dr. Rosaline Coma. Patient will pick up instructions from 2nd floor front desk. Patient verbalized understanding and had no concerns at the end of the call.  Pre-certification team notified that procedures have been moved up.

## 2023-12-22 NOTE — Telephone Encounter (Signed)
 Lets get stat KUB to evaluate for obstruction.  Okay to schedule for sooner appointment, please do 2 day prep.  Thanks [employer]

## 2023-12-23 ENCOUNTER — Ambulatory Visit: Payer: Self-pay | Admitting: Physician Assistant

## 2023-12-28 ENCOUNTER — Encounter: Payer: Self-pay | Admitting: Internal Medicine

## 2023-12-28 ENCOUNTER — Ambulatory Visit: Payer: Self-pay | Admitting: Internal Medicine

## 2023-12-28 VITALS — BP 116/66 | HR 66 | Temp 98.2°F | Resp 11 | Ht 67.0 in | Wt 177.0 lb

## 2023-12-28 DIAGNOSIS — Z1509 Genetic susceptibility to other malignant neoplasm: Secondary | ICD-10-CM

## 2023-12-28 DIAGNOSIS — Z860100 Personal history of colon polyps, unspecified: Secondary | ICD-10-CM | POA: Diagnosis not present

## 2023-12-28 DIAGNOSIS — Z1211 Encounter for screening for malignant neoplasm of colon: Secondary | ICD-10-CM

## 2023-12-28 DIAGNOSIS — K317 Polyp of stomach and duodenum: Secondary | ICD-10-CM | POA: Diagnosis not present

## 2023-12-28 DIAGNOSIS — D124 Benign neoplasm of descending colon: Secondary | ICD-10-CM

## 2023-12-28 DIAGNOSIS — K449 Diaphragmatic hernia without obstruction or gangrene: Secondary | ICD-10-CM | POA: Diagnosis not present

## 2023-12-28 DIAGNOSIS — K219 Gastro-esophageal reflux disease without esophagitis: Secondary | ICD-10-CM

## 2023-12-28 DIAGNOSIS — K2980 Duodenitis without bleeding: Secondary | ICD-10-CM | POA: Diagnosis not present

## 2023-12-28 DIAGNOSIS — K6289 Other specified diseases of anus and rectum: Secondary | ICD-10-CM | POA: Diagnosis not present

## 2023-12-28 DIAGNOSIS — D123 Benign neoplasm of transverse colon: Secondary | ICD-10-CM

## 2023-12-28 MED ORDER — SODIUM CHLORIDE 0.9 % IV SOLN
500.0000 mL | INTRAVENOUS | Status: DC
Start: 2023-12-28 — End: 2023-12-28

## 2023-12-28 NOTE — Progress Notes (Signed)
 Called to room to assist during endoscopic procedure.  Patient ID and intended procedure confirmed with present staff. Received instructions for my participation in the procedure from the performing physician.

## 2023-12-28 NOTE — Progress Notes (Signed)
 Sedate, gd SR, tolerated procedure well, VSS, report to RN

## 2023-12-28 NOTE — Op Note (Signed)
 [facility] Patient Name: [facility] Procedure Date: 12/28/2023 11:18 AM MRN: [id number] Endoscopist: Freada [place] , , [id number] Age: 63 Referring MD:  Date of Birth: 1961/07/22 Gender: Female Account #: [id number] Procedure:                Upper GI endoscopy Indications:              Hereditary nonpolyposis colorectal cancer (Lynch                            Syndrome) Medicines:                Monitored Anesthesia Care Procedure:                Pre-Anesthesia Assessment:                           - Prior to the procedure, a History and Physical                            was performed, and patient medications and                            allergies were reviewed. The patient's tolerance of                            previous anesthesia was also reviewed. The risks                            and benefits of the procedure and the sedation                            options and risks were discussed with the patient.                            All questions were answered, and informed consent                            was obtained. Prior Anticoagulants: The patient has                            taken no anticoagulant or antiplatelet agents. ASA                            Grade Assessment: II - A patient with mild systemic                            disease. After reviewing the risks and benefits,                            the patient was deemed in satisfactory condition to                            undergo the procedure.  After obtaining informed consent, the endoscope was                            passed under direct vision. Throughout the                            procedure, the patient's blood pressure, pulse, and                            oxygen saturations were monitored continuously. The                            GIF [id number] #[id number] was introduced through the                            mouth, and advanced to the second part of  duodenum.                            The upper GI endoscopy was accomplished without                            difficulty. The patient tolerated the procedure                            well. Scope In: Scope Out: Findings:                 The examined esophagus was normal.                           A 2 cm hiatal hernia was present.                           The exam of the stomach was otherwise normal.                           A single 2 mm sessile polyp with no bleeding was                            found in the duodenal bulb. The polyp was removed                            with a cold biopsy forceps. Resection and retrieval                            were complete. Complications:            No immediate complications. Estimated Blood Loss:     Estimated blood loss was minimal. Impression:               - Normal esophagus.                           - 2 cm hiatal hernia.                           -  A single duodenal polyp. Resected and retrieved. Recommendation:           - Await pathology results.                           - Perform a colonoscopy today. Dr Pedro Bourgeois "[name]" Winner,  12/28/2023 12:15:28 PM

## 2023-12-28 NOTE — Patient Instructions (Addendum)
 Resume previous diet Continue present medications Continue constipation therapies Await pathology results Office visit with Dr Venice Gillis 8/20 at 830 am   Handouts/information given for Hiatal Hernia, polyps, and constipation  YOU HAD AN ENDOSCOPIC PROCEDURE TODAY AT THE  ENDOSCOPY CENTER:   Refer to the procedure report that was given to you for any specific questions about what was found during the examination.  If the procedure report does not answer your questions, please call your gastroenterologist to clarify.  If you requested that your care partner not be given the details of your procedure findings, then the procedure report has been included in a sealed envelope for you to review at your convenience later.  YOU SHOULD EXPECT: Some feelings of bloating in the abdomen. Passage of more gas than usual.  Walking can help get rid of the air that was put into your GI tract during the procedure and reduce the bloating. If you had a lower endoscopy (such as a colonoscopy or flexible sigmoidoscopy) you may notice spotting of blood in your stool or on the toilet paper. If you underwent a bowel prep for your procedure, you may not have a normal bowel movement for a few days.  Please Note:  You might notice some irritation and congestion in your nose or some drainage.  This is from the oxygen used during your procedure.  There is no need for concern and it should clear up in a day or so.  SYMPTOMS TO REPORT IMMEDIATELY:  Following lower endoscopy (colonoscopy):  Excessive amounts of blood in the stool  Significant tenderness or worsening of abdominal pains  Swelling of the abdomen that is new, acute  Fever of 100F or higher Following upper endoscopy (EGD)  Vomiting of blood or coffee ground material  New chest pain or pain under the shoulder blades  Painful or persistently difficult swallowing  New shortness of breath  Black, tarry-looking stools For urgent or emergent issues, a  gastroenterologist can be reached at any hour by calling [phone]. Do not use MyChart messaging for urgent concerns.   DIET:  We do recommend a small meal at first, but then you may proceed to your regular diet.  Drink plenty of fluids but you should avoid alcoholic beverages for 24 hours.  ACTIVITY:  You should plan to take it easy for the rest of today and you should NOT DRIVE or use heavy machinery until tomorrow (because of the sedation medicines used during the test).    FOLLOW UP: Our staff will call the number listed on your records the next business day following your procedure.  We will call around 7:15- 8:00 am to check on you and address any questions or concerns that you may have regarding the information given to you following your procedure. If we do not reach you, we will leave a message.     If any biopsies were taken you will be contacted by phone or by letter within the next 1-3 weeks.  Please call us  at [phone] if you have not heard about the biopsies in 3 weeks.   SIGNATURES/CONFIDENTIALITY: You and/or your care partner have signed paperwork which will be entered into your electronic medical record.  These signatures attest to the fact that that the information above on your After Visit Summary has been reviewed and is understood.  Full responsibility of the confidentiality of this discharge information lies with you and/or your care-partner.

## 2023-12-28 NOTE — Progress Notes (Signed)
 GASTROENTEROLOGY PROCEDURE H&P NOTE   Primary Care Physician: Lonie Roa, MD    Reason for Procedure:   Lynch syndrome, GERD  Plan:    EGD/colonoscopy  Patient is appropriate for endoscopic procedure(s) in the ambulatory ([facility]) setting.  The nature of the procedure, as well as the risks, benefits, and alternatives were carefully and thoroughly reviewed with the patient. Ample time for discussion and questions allowed. The patient understood, was satisfied, and agreed to proceed.     HPI: Pamela Luna is a 63 y.o. female who presents for EGD/colonoscopy for evaluation of Lynch syndrome, GERD.  Patient was most recently seen in the Gastroenterology Clinic on 12/11/23.  No interval change in medical history since that appointment. Please refer to that note for full details regarding GI history and clinical presentation.   Past Medical History:  Diagnosis Date   Allergy    Anxiety    Cataract    Constipation    FHx: migraine headaches    GERD (gastroesophageal reflux disease)    H/O seasonal allergies    Heart murmur    H/O    History of Helicobacter pylori infection    Hyperlipidemia    Hypertension    IBS (irritable bowel syndrome)    Osteopenia    Osteoporosis    Ovarian cyst     Past Surgical History:  Procedure Laterality Date   CESAREAN SECTION  1998   COLONOSCOPY  04/15/2017; 04/26/2019   Small internal hermorrhoids. Otherwise normal colonoscopy.    CYSTOSCOPY N/A 01/10/2019   Procedure: CYSTOSCOPY;  Surgeon: Merryl Abraham, MD;  Location: [facility];  Service: Gynecology;  Laterality: N/A;   ESOPHAGOGASTRODUODENOSCOPY  06/30/2012   Small hiatal hernia. Mild gastritis.    LAPAROSCOPIC VAGINAL HYSTERECTOMY WITH SALPINGO OOPHORECTOMY N/A 01/10/2019   Procedure: LAPAROSCOPIC ASSISTED VAGINAL HYSTERECTOMY WITH RIGHT SALPINGO OOPHORECTOMY;  Surgeon: Merryl Abraham, MD;  Location: [facility] [place] [facility];  Service: Gynecology;  Laterality: N/A;    LEFT OOPHORECTOMY  04/20/12   POLYPECTOMY     polyps 2020   SCALP CYST EXCISION  2006 - 2009   TONSILLECTOMY  1966   UPPER GASTROINTESTINAL ENDOSCOPY  04/26/2019    Prior to Admission medications   Medication Sig Start Date End Date Taking? Authorizing Provider  calcium carbonate (OSCAL) 1500 (600 Ca) MG TABS tablet Take 600 mg by mouth daily.     [provider]  denosumab (PROLIA) 60 MG/ML SOSY injection Inject 60 mg into the skin every 6 (six) months. Last shot was 12/2018    [provider]  gabapentin (NEURONTIN) 100 MG capsule TAKE 1 CAPSULE BY MOUTH ONCE DAILY AT BEDTIME FOR 30 DAYS 09/12/20   [provider]  Glucosamine 500 MG CAPS Take by mouth.    [provider]  hydrOXYzine (ATARAX/VISTARIL) 25 MG tablet Take 25 mg by mouth 3 (three) times daily as needed for anxiety.    [provider]  Ibuprofen  200 MG CAPS Take by mouth.    [provider]  lisinopril -hydrochlorothiazide  (ZESTORETIC) 20-12.5 MG tablet Take 1 tablet by mouth 2 (two) times daily.    [provider]  Multiple Vitamins-Minerals (MULTIVITAMIN WITH MINERALS) tablet Take 1 tablet by mouth daily.    [provider]  pantoprazole  (PROTONIX ) 40 MG tablet Take 1 tablet by mouth once daily 07/20/23   Lajuan Pila, MD  polyethylene glycol (MIRALAX / GLYCOLAX) 17 g packet Take 17 g by mouth daily.    [provider]  pravastatin (PRAVACHOL)  10 MG tablet Take 10 mg by mouth 2 (two) times a week. Wed and Sat    [provider]  venlafaxine  XR (EFFEXOR -XR) 150 MG 24 hr capsule Take 150 mg by mouth daily. 05/18/18   [provider]  verapamil  (VERELAN  PM) 180 MG 24 hr capsule Take 180 mg by mouth at bedtime.    [provider]    Current Outpatient Medications  Medication Sig Dispense Refill   dicyclomine (BENTYL) 20 MG tablet Take 20 mg by mouth every 6 (six) hours as needed.     gabapentin (NEURONTIN) 100 MG capsule  TAKE 1 CAPSULE BY MOUTH ONCE DAILY AT BEDTIME FOR 30 DAYS     Ibuprofen  200 MG CAPS Take by mouth.     lisinopril -hydrochlorothiazide  (ZESTORETIC) 20-12.5 MG tablet Take 1 tablet by mouth 2 (two) times daily.     Multiple Vitamins-Minerals (MULTIVITAMIN WITH MINERALS) tablet Take 1 tablet by mouth daily.     ondansetron  (ZOFRAN -ODT) 8 MG disintegrating tablet Take 8 mg by mouth every 8 (eight) hours as needed.     pantoprazole  (PROTONIX ) 40 MG tablet Take 1 tablet by mouth once daily 90 tablet 0   polyethylene glycol (MIRALAX / GLYCOLAX) 17 g packet Take 17 g by mouth daily.     pravastatin (PRAVACHOL) 10 MG tablet Take 10 mg by mouth 2 (two) times a week. Wed and Sat     venlafaxine  XR (EFFEXOR -XR) 150 MG 24 hr capsule Take 150 mg by mouth daily.     verapamil  (VERELAN  PM) 180 MG 24 hr capsule Take 180 mg by mouth at bedtime.     calcium carbonate (OSCAL) 1500 (600 Ca) MG TABS tablet Take 600 mg by mouth daily.  (Patient not taking: Reported on 12/28/2023)     denosumab (PROLIA) 60 MG/ML SOSY injection Inject 60 mg into the skin every 6 (six) months. Last shot was 12/2018     Glucosamine 500 MG CAPS Take by mouth.     hydrOXYzine (ATARAX/VISTARIL) 25 MG tablet Take 25 mg by mouth 3 (three) times daily as needed for anxiety.     Current Facility-Administered Medications  Medication Dose Route Frequency Provider Last Rate Last Admin   0.9 %  sodium chloride  infusion  500 mL Intravenous Continuous Daina Drum, MD        Allergies as of 12/28/2023   (No Known Allergies)    Family History  Problem Relation Age of Onset   Cancer Mother        History of Colon Cancer 2004 in her late 16's   Colon cancer Mother        46's   Diabetes Father    Arthritis Father    Heart disease Father    Colon polyps Sister    Cancer Maternal Uncle    Colon cancer Maternal Uncle        60's   Cancer Maternal Uncle    Cancer Maternal Grandmother    Stomach cancer Maternal Grandmother    Cancer Cousin     Cancer Other        grant grandmother had breast cancer    Esophageal cancer Neg Hx    Rectal cancer Neg Hx     Social History   Socioeconomic History   Marital status: Married    Spouse name: Not on file   Number of children: 1   Years of education: Not on file   Highest education level: Not on file  Occupational History  Not on file  Tobacco Use   Smoking status: Former    Current packs/day: 0.00    Average packs/day: 0.3 packs/day for 16.0 years (4.0 ttl pk-yrs)    Types: Cigarettes    Start date: 12/15/1985    Quit date: 12/15/2001    Years since quitting: 22.0   Smokeless tobacco: Never  Vaping Use   Vaping status: Former   Devices: Tried them for a few months. Said no nicotine  Substance and Sexual Activity   Alcohol use: Yes    Comment: Rarely   Drug use: No   Sexual activity: Yes  Other Topics Concern   Not on file  Social History Narrative   Not on file   Social Drivers of Health   Financial Resource Strain: Not on file  Food Insecurity: Not on file  Transportation Needs: Not on file  Physical Activity: Not on file  Stress: Not on file  Social Connections: Not on file  Intimate Partner Violence: Not on file    Physical Exam: Vital signs in last 24 hours: BP 108/65   Pulse 87   Temp 98.2 F (36.8 C)   Ht 5\' 7"  (1.702 m)   Wt 177 lb (80.3 kg)   LMP 08/16/2016   SpO2 97%   BMI 27.72 kg/m  GEN: NAD EYE: Sclerae anicteric ENT: MMM CV: Non-tachycardic Pulm: No increased WOB GI: Soft NEURO:  Alert & Oriented   Regino Caprio, MD [facility]   12/28/2023 11:13 AM

## 2023-12-28 NOTE — Progress Notes (Signed)
 Pt's states no medical or surgical changes since previsit or office visit.

## 2023-12-28 NOTE — Op Note (Signed)
 [facility] Patient Name: Pamela Luna Procedure Date: 12/28/2023 11:17 AM MRN: [id number] Endoscopist: Freada Jacobs [place] , , [id number] Age: 63 Referring MD:  Date of Birth: 04-20-1961 Gender: Female Account #: [id number] Procedure:                Colonoscopy Indications:              Lynch Syndrome Medicines:                Monitored Anesthesia Care Procedure:                Pre-Anesthesia Assessment:                           - Prior to the procedure, a History and Physical                            was performed, and patient medications and                            allergies were reviewed. The patient's tolerance of                            previous anesthesia was also reviewed. The risks                            and benefits of the procedure and the sedation                            options and risks were discussed with the patient.                            All questions were answered, and informed consent                            was obtained. Prior Anticoagulants: The patient has                            taken no anticoagulant or antiplatelet agents. ASA                            Grade Assessment: II - A patient with mild systemic                            disease. After reviewing the risks and benefits,                            the patient was deemed in satisfactory condition to                            undergo the procedure.                           After obtaining informed consent, the colonoscope  was passed under direct vision. Throughout the                            procedure, the patient's blood pressure, pulse, and                            oxygen saturations were monitored continuously. The                            [id number]-HQ190L Colonoscope [id number] was introduced                            through the anus and advanced to the the terminal                            ileum. The colonoscopy was performed  without                            difficulty. The patient tolerated the procedure                            well. The quality of the bowel preparation was                            good. The terminal ileum, ileocecal valve,                            appendiceal orifice, and rectum were photographed. Scope In: 11:36:15 AM Scope Out: 12:04:11 PM Scope Withdrawal Time: 0 hours 19 minutes 54 seconds  Total Procedure Duration: 0 hours 27 minutes 56 seconds  Findings:                 The terminal ileum appeared normal.                           Four sessile polyps were found in the descending                            colon and transverse colon. The polyps were 3 to 10                            mm in size. These polyps were removed with a cold                            snare. Resection and retrieval were complete.                           Localized inflammation characterized by congestion                            (edema), erosions and erythema was found in the                            rectum. Biopsies were taken with a  cold forceps for                            histology. Complications:            No immediate complications. Estimated Blood Loss:     Estimated blood loss was minimal. Impression:               - The examined portion of the ileum was normal.                           - Four 3 to 10 mm polyps in the descending colon                            and in the transverse colon, removed with a cold                            snare. Resected and retrieved.                           - Localized inflammation was found in the rectum.                            Biopsied. Recommendation:           - Discharge patient to home (with escort).                           - Await pathology results.                           - Continue constipation therapies.                           - Return to GI clinic with Dr. Venice Gillis or APP in 2-3                            months.                            - The findings and recommendations were discussed                            with the patient. Dr Pedro Bourgeois "[name]" Rosaline Coma,  12/28/2023 12:19:14 PM

## 2023-12-29 ENCOUNTER — Telehealth: Payer: Self-pay

## 2023-12-29 NOTE — Telephone Encounter (Signed)
  Follow up Call-     12/28/2023   10:57 AM 12/13/2021    7:14 AM  Call back number  Post procedure Call Back phone  # [phone]  Permission to leave phone message Yes Yes     Patient questions:  Do you have a fever, pain , or abdominal swelling? No. Pain Score  0 *  Have you tolerated food without any problems? Yes.    Have you been able to return to your normal activities? Yes.    Do you have any questions about your discharge instructions: Diet   No. Medications  No. Follow up visit  No.  Do you have questions or concerns about your Care? No.  Actions: * If pain score is 4 or above: No action needed, pain <4.

## 2023-12-31 ENCOUNTER — Ambulatory Visit: Payer: Self-pay | Admitting: Internal Medicine

## 2023-12-31 LAB — SURGICAL PATHOLOGY

## 2024-01-20 ENCOUNTER — Encounter: Payer: Self-pay | Admitting: Gastroenterology

## 2024-01-23 ENCOUNTER — Other Ambulatory Visit: Payer: Self-pay | Admitting: Gastroenterology

## 2024-02-15 ENCOUNTER — Encounter: Payer: Self-pay | Admitting: Gastroenterology

## 2024-03-16 ENCOUNTER — Encounter: Payer: Self-pay | Admitting: Gastroenterology

## 2024-03-16 ENCOUNTER — Ambulatory Visit: Admitting: Gastroenterology

## 2024-03-16 VITALS — BP 96/60 | HR 72 | Ht 66.0 in | Wt 169.5 lb

## 2024-03-16 DIAGNOSIS — K219 Gastro-esophageal reflux disease without esophagitis: Secondary | ICD-10-CM

## 2024-03-16 DIAGNOSIS — K449 Diaphragmatic hernia without obstruction or gangrene: Secondary | ICD-10-CM

## 2024-03-16 DIAGNOSIS — Z860100 Personal history of colon polyps, unspecified: Secondary | ICD-10-CM

## 2024-03-16 DIAGNOSIS — Z1509 Genetic susceptibility to other malignant neoplasm: Secondary | ICD-10-CM

## 2024-03-16 MED ORDER — PANTOPRAZOLE SODIUM 40 MG PO TBEC: 40.0000 mg | DELAYED_RELEASE_TABLET | Freq: Every day | ORAL | 3 refills | Status: AC

## 2024-03-16 NOTE — Progress Notes (Signed)
 Chief Complaint: New diagnosis for Lynch syndrome  Referring Provider:  Trinidad Hun, MD      ASSESSMENT AND PLAN;   #1. 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 FH colon ca (mom at age 64, stomach Ca MGM)  #2. GERD with small HH  #3. H/O Polyps 12/2023  Plan: -Proceed with EGD/colon in 9yrs (12/2025) -Protonix  40 mg PO every day #90, 4RF -FU as needed.    HPI:    Pamela Luna is a 63 y.o. female  With Lynch syndrome with positive PMS2 mutation.  FU after EGD/colon 12/28/2023 Doing well.  No diarrhea.  More with constipation better with miralax.  No melena or hematochezia  EGD revealed 2 cm hiatal hernia, single benign duodenal polyp which was resected and retrieved.  Colonoscopy revealed four 3 to 10 mm polyps status post polypectomy.  Biopsies came back as SSA, tubular adenomas, lymphoid hyperplasia.  Rectal biopsies consistent with acute colitis.  No evidence of Crohn's disease.  She denies having any GI complaints.   Highly motivated.  Starting exercise like walking with niece.  Past GI procedures: Colonoscopy 12/28/2023: - The examined portion of the ileum was normal. - Four 3 to 10 mm polyps in the descending colon and in the transverse colon, removed with a cold snare. Resected and retrieved. - Localized inflammation was found in the rectum. Bx-acute colitis. - Bx- SSA, tubular adenoma, hyperplastic/lymphoid - Repeat in 2 years  EGD 12/28/2023: Normal esophagus. - 2 cm hiatal hernia. - A single duodenal polyp. Resected and retrieved. Bx-negative.  -colon 03/2017 ([facility]): neg except hoids. -EGD12/2013: small HH, mild gastritis, neg SBx. Past Medical History:  Diagnosis Date   Allergy    Anxiety    Cataract    Constipation    FHx: migraine headaches    GERD (gastroesophageal reflux disease)    H/O seasonal allergies    Heart murmur    H/O    History of Helicobacter pylori infection    Hyperlipidemia    Hypertension    IBS (irritable bowel  syndrome)    Osteopenia    Osteoporosis    Ovarian cyst     Past Surgical History:  Procedure Laterality Date   CESAREAN SECTION  1998   COLONOSCOPY  04/15/2017; 04/26/2019   Small internal hermorrhoids. Otherwise normal colonoscopy.    CYSTOSCOPY N/A 01/10/2019   Procedure: CYSTOSCOPY;  Surgeon: Leva Rush, MD;  Location: [facility];  Service: Gynecology;  Laterality: N/A;   ESOPHAGOGASTRODUODENOSCOPY  06/30/2012   Small hiatal hernia. Mild gastritis.    LAPAROSCOPIC VAGINAL HYSTERECTOMY WITH SALPINGO OOPHORECTOMY N/A 01/10/2019   Procedure: LAPAROSCOPIC ASSISTED VAGINAL HYSTERECTOMY WITH RIGHT SALPINGO OOPHORECTOMY;  Surgeon: Leva Rush, MD;  Location: [facility] [place];  Service: Gynecology;  Laterality: N/A;   LEFT OOPHORECTOMY  04/20/12   POLYPECTOMY     polyps 2020   SCALP CYST EXCISION  2006 - 2009   TONSILLECTOMY  1966   UPPER GASTROINTESTINAL ENDOSCOPY  04/26/2019    Family History  Problem Relation Age of Onset   Cancer Mother        History of Colon Cancer 2004 in her late 91's   Colon cancer Mother        55's   Diabetes Father    Arthritis Father    Heart disease Father    Colon polyps Sister    Cancer Maternal Uncle    Colon cancer Maternal Uncle        [phone]  Cancer Maternal Uncle    Cancer Maternal Grandmother    Stomach cancer Maternal Grandmother    Cancer Cousin    Cancer Other        grant grandmother had breast cancer    Esophageal cancer Neg Hx    Rectal cancer Neg Hx     Social History   Tobacco Use   Smoking status: Former    Current packs/day: 0.00    Average packs/day: 0.3 packs/day for 16.0 years (4.0 ttl pk-yrs)    Types: Cigarettes    Start date: 12/15/1985    Quit date: 12/15/2001    Years since quitting: 22.2   Smokeless tobacco: Never  Vaping Use   Vaping status: Former   Devices: Tried them for a few months. Said no nicotine  Substance Use Topics   Alcohol use: Yes    Comment: Rarely   Drug use:  No    Current Outpatient Medications  Medication Sig Dispense Refill   denosumab (PROLIA) 60 MG/ML SOSY injection Inject 60 mg into the skin every 6 (six) months. Last shot was 12/2018     gabapentin (NEURONTIN) 100 MG capsule TAKE 1 CAPSULE BY MOUTH ONCE DAILY AT BEDTIME FOR 30 DAYS     Glucosamine 500 MG CAPS Take by mouth.     hydrOXYzine (ATARAX/VISTARIL) 25 MG tablet Take 25 mg by mouth 3 (three) times daily as needed for anxiety.     lisinopril -hydrochlorothiazide  (ZESTORETIC) 20-25 MG tablet Take 1 tablet by mouth daily.     Multiple Vitamins-Minerals (MULTIVITAMIN WITH MINERALS) tablet Take 1 tablet by mouth daily.     pantoprazole  (PROTONIX ) 40 MG tablet Take 1 tablet by mouth once daily 90 tablet 0   polyethylene glycol (MIRALAX / GLYCOLAX) 17 g packet Take 17 g by mouth daily.     pravastatin (PRAVACHOL) 10 MG tablet Take 10 mg by mouth 2 (two) times a week. Wed and Sat     venlafaxine  XR (EFFEXOR -XR) 150 MG 24 hr capsule Take 150 mg by mouth daily.     verapamil  (CALAN -SR) 240 MG CR tablet Take 240 mg by mouth daily. (Patient taking differently: Take 240 mg by mouth. 4 times a week)     No current facility-administered medications for this visit.    No Known Allergies       Physical Exam:    BP 96/60 (BP Location: Left Arm, Patient Position: Sitting, Cuff Size: Normal)   Pulse 72   Ht 5' 6 (1.676 m) Comment: height measured without shoes  Wt 169 lb 8 oz (76.9 kg)   LMP 08/16/2016   BMI 27.36 kg/m  Filed Weights   03/16/24 0820  Weight: 169 lb 8 oz (76.9 kg)   Constitutional:  Well-developed, in no acute distress. Psychiatric: Normal mood and affect. Behavior is normal.  Data Reviewed: I have personally reviewed following labs and imaging studies  CBC:    Latest Ref Rng & Units 01/11/2019    5:50 AM 01/03/2019    8:08 AM 04/16/2012   10:55 AM  CBC  WBC 4.0 - 10.5 K/uL 12.2  5.6  5.5   Hemoglobin 12.0 - 15.0 g/dL 88.6  86.5  85.8   Hematocrit 36.0 - 46.0 %  34.9  41.4  41.0   Platelets 150 - 400 K/uL 198  211  220     CMP:    Latest Ref Rng & Units 01/03/2019    8:08 AM 04/16/2012   10:55 AM 03/14/2012    3:46 PM  CMP  Glucose 70 - 99 mg/dL 886  89  896   BUN 6 - 20 mg/dL 17  13  7    Creatinine 0.44 - 1.00 mg/dL 9.34  9.37  9.26   Sodium 135 - 145 mmol/L 139  138  137   Potassium 3.5 - 5.1 mmol/L 4.4  4.4  4.9   Chloride 98 - 111 mmol/L 102  100  97   CO2 22 - 32 mmol/L 30  29  32   Calcium 8.9 - 10.3 mg/dL 9.4  9.5  89.7   Total Protein 6.5 - 8.1 g/dL 7.4  7.4  7.3   Total Bilirubin 0.3 - 1.2 mg/dL 0.1  0.3  0.6   Alkaline Phos 38 - 126 U/L 51  52  57   AST 15 - 41 U/L 21  20  16    ALT 0 - 44 U/L 17  11  12     No charge since it was a follow-up from procedures   Anselm Bring, MD 03/16/2024, 8:52 AM  Cc: Trinidad Hun, MD

## 2024-03-16 NOTE — Patient Instructions (Addendum)
 _______________________________________________________  If your blood pressure at your visit was 140/90 or greater, please contact your primary care physician to follow up on this.  _______________________________________________________  If you are age 64 or older, your body mass index should be between 23-30. Your Body mass index is 27.36 kg/m. If this is out of the aforementioned range listed, please consider follow up with your Primary Care Provider.  If you are age 94 or younger, your body mass index should be between 19-25. Your Body mass index is 27.36 kg/m. If this is out of the aformentioned range listed, please consider follow up with your Primary Care Provider.   ________________________________________________________  The [facility] providers would like to encourage you to use MYCHART to communicate with providers for non-urgent requests or questions.  Due to long hold times on the telephone, sending your provider a message by [facility] may be a faster and more efficient way to get a response.  Please allow 48 business hours for a response.  Please remember that this is for non-urgent requests.  _______________________________________________________  [facility] is using a team-based approach to care.  Your team is made up of your doctor and two to three APPS. Our APPS (Nurse Practitioners and Physician Assistants) work with your physician to ensure care continuity for you. They are fully qualified to address your health concerns and develop a treatment plan. They communicate directly with your gastroenterologist to care for you. Seeing the Advanced Practice Practitioners on your physician's team can help you by facilitating care more promptly, often allowing for earlier appointments, access to diagnostic testing, procedures, and other specialty referrals.   We have sent the following medications to your pharmacy for you to pick up at your convenience: Protonix   Repeat  EGD/colonoscopy for 12-2025. Please call 2 months prior to schedule this. A letter will be sent as it gets closer.  Thank you,  Dr. Lynnie Bring
